# Patient Record
Sex: Male | Born: 1955 | Race: White | Hispanic: No | Marital: Married | Smoking: Never smoker
Health system: Southern US, Community
[De-identification: ages and names within clinical notes are randomized; demographics above are authoritative.]

## PROBLEM LIST (undated history)

## (undated) DIAGNOSIS — I10 Essential (primary) hypertension: Secondary | ICD-10-CM

## (undated) DIAGNOSIS — R251 Tremor, unspecified: Secondary | ICD-10-CM

## (undated) DIAGNOSIS — E785 Hyperlipidemia, unspecified: Secondary | ICD-10-CM

## (undated) DIAGNOSIS — F419 Anxiety disorder, unspecified: Secondary | ICD-10-CM

## (undated) DIAGNOSIS — K649 Unspecified hemorrhoids: Secondary | ICD-10-CM

## (undated) HISTORY — PX: COLONOSCOPY: SHX174

## (undated) HISTORY — DX: Tremor, unspecified: R25.1

## (undated) HISTORY — DX: Anxiety disorder, unspecified: F41.9

## (undated) HISTORY — DX: Unspecified hemorrhoids: K64.9

## (undated) HISTORY — DX: Essential (primary) hypertension: I10

## (undated) HISTORY — DX: Hyperlipidemia, unspecified: E78.5

---
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
## 2005-07-05 ENCOUNTER — Ambulatory Visit: Payer: Self-pay | Admitting: Family Medicine

## 2006-12-30 ENCOUNTER — Emergency Department: Payer: Self-pay | Admitting: Emergency Medicine

## 2006-12-31 ENCOUNTER — Other Ambulatory Visit: Payer: Self-pay

## 2007-03-19 ENCOUNTER — Ambulatory Visit: Payer: Self-pay | Admitting: Family Medicine

## 2009-08-30 ENCOUNTER — Ambulatory Visit: Payer: Self-pay | Admitting: Gastroenterology

## 2010-10-11 ENCOUNTER — Observation Stay: Payer: Self-pay | Admitting: Internal Medicine

## 2012-07-15 ENCOUNTER — Emergency Department: Payer: Self-pay | Admitting: Unknown Physician Specialty

## 2012-07-15 LAB — COMPREHENSIVE METABOLIC PANEL
Albumin: 4 g/dL (ref 3.4–5.0)
Alkaline Phosphatase: 78 U/L (ref 50–136)
Anion Gap: 6 — ABNORMAL LOW (ref 7–16)
BUN: 11 mg/dL (ref 7–18)
Bilirubin,Total: 0.3 mg/dL (ref 0.2–1.0)
Calcium, Total: 8.5 mg/dL (ref 8.5–10.1)
Chloride: 107 mmol/L (ref 98–107)
Co2: 29 mmol/L (ref 21–32)
Creatinine: 1.08 mg/dL (ref 0.60–1.30)
EGFR (African American): >60
EGFR (Non-African Amer.): >60
Glucose: 88 mg/dL (ref 65–99)
Osmolality: 282 (ref 275–301)
Potassium: 4.5 mmol/L (ref 3.5–5.1)
SGOT(AST): 26 U/L (ref 15–37)
SGPT (ALT): 28 U/L (ref 12–78)
Sodium: 142 mmol/L (ref 136–145)
Total Protein: 7.7 g/dL (ref 6.4–8.2)

## 2012-07-15 LAB — CBC
HCT: 44.4 % (ref 40.0–52.0)
HGB: 15.2 g/dL (ref 13.0–18.0)
MCH: 31.9 pg (ref 26.0–34.0)
MCHC: 34.2 g/dL (ref 32.0–36.0)
MCV: 93 fL (ref 80–100)
Platelet: 275 10*3/uL (ref 150–440)
RBC: 4.76 10*6/uL (ref 4.40–5.90)
RDW: 13.1 % (ref 11.5–14.5)
WBC: 7.7 10*3/uL (ref 3.8–10.6)

## 2012-07-15 LAB — CK TOTAL AND CKMB (NOT AT ARMC)
CK, Total: 156 U/L (ref 35–232)
CK-MB: 1 ng/mL (ref 0.5–3.6)

## 2012-07-15 LAB — TROPONIN I: Troponin-I: <0.02 ng/mL

## 2013-10-22 ENCOUNTER — Ambulatory Visit: Payer: Self-pay | Admitting: Family Medicine

## 2015-03-08 ENCOUNTER — Encounter: Payer: Self-pay | Admitting: Psychiatry

## 2015-03-08 ENCOUNTER — Ambulatory Visit (INDEPENDENT_AMBULATORY_CARE_PROVIDER_SITE_OTHER): Payer: 59 | Admitting: Psychiatry

## 2015-03-08 VITALS — BP 138/88 | HR 70 | Temp 98.0°F | Ht 68.5 in

## 2015-03-08 DIAGNOSIS — F331 Major depressive disorder, recurrent, moderate: Secondary | ICD-10-CM

## 2015-03-08 DIAGNOSIS — F411 Generalized anxiety disorder: Secondary | ICD-10-CM | POA: Diagnosis not present

## 2015-03-08 NOTE — Progress Notes (Signed)
Psychiatric Initial Adult Assessment   Patient Identification: Sean Bailey MRN:  [id number] Date of Evaluation:  03/08/2015 Referral Source: Dr Sherryll BurgerShah, PCP  Chief Complaint:  "I quit my last 2 jobs".  Visit Diagnosis: No diagnosis found. Diagnosis:  There are no active problems to display for this patient.  History of Present Illness:    Pt is a 59 yo married male, presented for initial assessment. He stated that he was referred by his PCP, Dr Sherryll BurgerShah.  He stated that  he does not feel depressed but " I am loosing passion, sense of value, worth, and no excitement in any thing. He stated that he is still interested in different activities but does not know what is his "passion ". 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 Patient reported that he is concerned as his 57 year old daughter lives with him along with her children. 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 He reported that he has received some financial stability in his life after the death of his mother due to her property and they were able to pay some of their bills and he has also invested.  He reported that he does not have any relationship problems with his wife. He reported that he will wake up early in the morning and go to the gym to exercise with his friends. Patient reported that he does not think that he needs medication at this time as he has already stopped taking the Lexapro as it was not helping him. He takes Xanax on a when necessary basis. He currently denied having any suicidal homicidal ideations or plans. He denied having any perceptual disturbances.  He feels that not having a stable job is the main stressor in his life which is  making him depressed and he wants to talk to her therapist about achieving balance.  Elements:  Location:  as noted above. . Duration:  for the past year. Associated Signs/Symptoms: Depression Symptoms:  depressed mood, anhedonia, hypersomnia, fatigue, hopelessness, disturbed sleep, (Hypo) Manic Symptoms:  Elevated Mood, Irritable Mood, Anxiety Symptoms:  Excessive Worry, Psychotic Symptoms:  none PTSD Symptoms: Negative  Past Medical History:  Past Medical History  Diagnosis Date  . Hyperlipidemia   . Hypertension   . Anxiety     Past Surgical History  Procedure Laterality Date  . Appendectomy     Family History:  Family History  Problem Relation Age of Onset  . Depression Brother    Social History:   History   Social History  . Marital Status: Married    Spouse Name: N/A  . Number of Children: N/A  . Years of Education: N/A   Social History Main Topics  . Smoking status: Never Smoker   . Smokeless tobacco: Former NeurosurgeonUser    Quit date: 03/07/2014  . Alcohol Use: 2.4 oz/week    0 Standard drinks or equivalent, 3 Glasses of wine, 0 Shots of liquor, 1 Cans of beer per week  . Drug Use: No  . Sexual Activity: Yes    Birth Control/ Protection: None   Other Topics Concern  . None   Social History Narrative  . 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 Married since 1988. Lives with wife and a 34 yo daughter. He stated that he is not taking Lexapro as it was not helpful. Taking xanax only prn at night.   Musculoskeletal: Strength & Muscle Tone: within normal limits Gait & Station: normal Patient leans: Right  Psychiatric Specialty Exam: HPI  Review of Systems  Constitutional: Positive for malaise/fatigue. Negative for chills.  HENT: Negative for ear discharge and hearing loss.   Eyes: Negative for photophobia.  Respiratory: Negative for  hemoptysis.   Cardiovascular: Negative for palpitations and leg swelling.  Gastrointestinal: Positive for abdominal pain.  Genitourinary: Negative for frequency.  Musculoskeletal: Positive for back pain and neck pain.  Skin: Negative for itching.  Neurological: Negative for sensory change and seizures.  Endo/Heme/Allergies: Negative for environmental allergies.  Psychiatric/Behavioral: Negative for depression and substance abuse. The patient is nervous/anxious.     Blood pressure 138/88, pulse 70, temperature 98 F (36.7 C), temperature source Tympanic, height 5' 8.5" (1.74 m), SpO2 95 %.There is no weight on file to calculate BMI.  General Appearance: Casual  Eye Contact:  Fair  Speech:  Normal Rate  Volume:  Normal  Mood:  Anxious and Depressed  Affect:  Appropriate  Thought Process:  Logical  Orientation:  Full (Time, Place, and Person)  Thought Content:  WDL  Suicidal Thoughts:  No  Homicidal Thoughts:  No  Memory:  Immediate;   Fair  Judgement:  Fair  Insight:  Fair  Psychomotor Activity:  Normal  Concentration:  Fair  Recall:  [employer] of Knowledge:Fair  Language: Fair  Akathisia:  No  Handed:  Right  AIMS (if indicated):  none  Assets:  Communication Skills Desire for Improvement Physical Health  ADL's:  Intact  Cognition: WNL  Sleep:  6   Is the patient at risk to self?  No. Has the patient been a risk to self in the past 6 months?  No. Has the patient been a risk to self within the distant past?  No. Is the patient a risk to others?  No. Has the patient been a risk to others in the past 6 months?  No. Has the patient been a risk to others within the distant past?  No.  Allergies:  No Known Allergies Current Medications: No current outpatient prescriptions on file.   No current facility-administered medications for this visit.    Previous Psychotropic Medications: Dr Eliott Nine[facility] [place] - last year  Dr Sherryll Burger- PCP started on  medications but Pt does not rememeber.    Substance Abuse History in the last 12 months:  No.  Consequences of Substance Abuse: Negative  Medical Decision Making:  Established Problem, Stable/Improving (1)  Treatment Plan Summary: Medication management and Plan Discussed with pt and he agreed.   Patient currently denied having any suicidal ideations or plans and does not want to take any psychotherapy medication He reported that he has also called career. Counseling is [facility] and they have suggested that she should seek therapy and medication management However patient is not interested in medications at this time He will start therapy and will come back for a follow-up appointment in 2 months No medications dispensed at this time    Sean Bailey 6/7/20161:16 PM

## 2015-03-16 ENCOUNTER — Telehealth: Payer: Self-pay | Admitting: Psychiatry

## 2015-03-23 ENCOUNTER — Ambulatory Visit (INDEPENDENT_AMBULATORY_CARE_PROVIDER_SITE_OTHER): Payer: 59 | Admitting: Licensed Clinical Social Worker

## 2015-03-23 ENCOUNTER — Other Ambulatory Visit: Payer: Self-pay

## 2015-03-23 ENCOUNTER — Encounter: Payer: Self-pay | Admitting: Licensed Clinical Social Worker

## 2015-03-23 DIAGNOSIS — F331 Major depressive disorder, recurrent, moderate: Secondary | ICD-10-CM

## 2015-03-23 DIAGNOSIS — F411 Generalized anxiety disorder: Secondary | ICD-10-CM | POA: Diagnosis not present
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
## 2015-03-23 NOTE — Telephone Encounter (Signed)
this is 2nd time patient called about rx.  according to your office visit note no medication was order but pt states that you was suppose to given him a medication for his tremors.

## 2015-03-24 NOTE — Telephone Encounter (Signed)
Pt declined medications and was referred for therapy.

## 2015-04-06 ENCOUNTER — Ambulatory Visit (INDEPENDENT_AMBULATORY_CARE_PROVIDER_SITE_OTHER): Payer: 59 | Admitting: Licensed Clinical Social Worker

## 2015-04-06 DIAGNOSIS — F411 Generalized anxiety disorder: Secondary | ICD-10-CM | POA: Diagnosis not present

## 2015-04-06 DIAGNOSIS — F331 Major depressive disorder, recurrent, moderate: Secondary | ICD-10-CM | POA: Diagnosis not present

## 2015-04-06 NOTE — Progress Notes (Signed)
THERAPIST PROGRESS NOTE  Patient:  Sean Bailey   DOB: [birth year]  MR Number: [id number]  Location: [facility], [address], [place], [place] [address]  Start: 9:05 a.m. End: 10:15 a.m.  Provider/Observer:     Felecia Jan, MSW, LCSW   Reason For Service:     Anxiety, Phase of Life Problem, Depression  Content of Session:   Sean Bailey, Sean Bailey, as client prefers to be called, is a friendly, anxious, casually groomed 59 year old MWM who returns to OPT to address his anxiety primarily. "The concern I have is if I follow my gut is it leading me down the wrong road?" Continues to    question self "What should I be doing and is my approach the best.?" He is questioning himself RE: "Am I doing enough?  Should I have stayed with insurance? "No one would accuse me of being successful."  In response to LCSW's question about    his beliefs about his success, client stated "I didn't say that I thought I was unsuccessful."     "I'm trying to focus on things so that I don't have the anxiety.  I feel unsettled about the path that I am on." He feels somewhat optimistic that there are positive things that he is doing at present.  "My current retirement age is 15 because I don't see    have the resources to retire before then."  His evidence for being on the right path would be working somewhere with benefits and insurance."  Primary struggle that increases his daily anxiety is around being able to provide financially for his family.     Family dynamics were discussed with one adult daughter and her children living in the home and wife not working, further reinforcing client's belief that he is responsible for the financial security of his family.     Ongoing struggle to decide what he wants to do with his life and he talked more about a business/character education program that he would like to pursue yet is realistic that he needs a full time job.  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 I don't have a crystal ball."     Rated his anxiety as a 6 out of 10.  "I need to see that my ability to pull in income is getting better."  He has not started the Lexapro that is in client's medication order section of the chart.  He is agreeable to taking this as prescribed by Dr. Garnetta Buddy.     Reported taking the Xanax and that he has about two pills left.  Was uncertain if he had a refill.  Informed LCSW that he sees Dr. Garnetta Buddy again in August 2016.  Voiced some anxiety around upcoming beach trip citing "This will be our first family    vacation in at least 15 years."   Participation Level:   Active  Participation Quality:  Attentive      Behavioral Observation:  Casual, Alert, and Blunt.   Current Psychosocial Factors: Family dynamics with adult daughter not working and living in the home; insufficient employment/income; wife's health  Current Symptoms:  Anxiety, shaky self-esteem, racing thoughts, indecisiveness  Patient Progress:   Exceptional X  Steady Slow Regressing   Stable   Maintaining    Suicidal Ideation/Intention/Plan:     Not present  Target Goals:   "To focus on the thinking errors."  Decrease anxiety responses  Goals Addressed Today:    Use of cognitive re-framing to decrease anxious and depressed thoughts.  Barriers:   Indecisiveness; family dynamics; financial strain  Therapist Response/Interventions:  Assisted client to begin to increase insight and identification into patterns of certain thoughts and behaviors and the resulting consequences.  Supportive therapy with insight to assist client to begin identifying thinking errors that he may be operating        with and assisted to re-frame rigid view of himself and negative impact of certain words such as "should, should not, ought to, always, never, must  ..."  LCSW explored client's automatic thoughts with assertiveness and not meeting others' needs       and offered much education around boundary setting, detachment with love and unhealthy enabling behaviors.  Encouraged client to consider what role early childhood criticism has played in creating an internal critic that challenges and questions       his ability to make sound decisions.  Offered ongoing support and empowerment RE: client's protective factors and validated feelings as also a part of a phase of life dilemma.  Provided him handout that discusses Marilynne Halstedrik Erickson's Generativity vs       Stagnation psychosocial developmental stage of life.  Encouraged client to read this hand-out and to highlight those thinking errors that apply to him on a daily basis with goal of next session to begin using CBT models to identify and re-frame these       irrational thoughts.  Diagnosis:   Generalized Anxiety Disorder                       Major Depressive Disorder, Recurrent, Moderate   Plan:  LCSW will collaborate with Dr. Garnetta BuddyFaheem regarding order for Lexapro 10mg .  Client will complete all readings assigned, keep all scheduled appointments and communicate any side effects of medication or worsening symptoms to Dr. Garnetta BuddyFaheem or LCSW.  Follow up for OPT in two             weeks.      Felecia Janina Benjimin Hadden, LCSW 04/06/2015

## 2015-04-11 ENCOUNTER — Telehealth: Payer: Self-pay | Admitting: Licensed Clinical Social Worker

## 2015-04-14 NOTE — Telephone Encounter (Signed)
Discussed case with Dr. Garnetta BuddyFaheem. LCSW called client and confirmed that PCP ordered the Lexapro 10 mg & Alprazolam, both of which he is out of. He only took 30 days of Lexapro. Recommended call to PCP for refills both meds until appointment with Dr. Garnetta BuddyFaheem   By Felecia Janina Darreld Hoffer, LCSW

## 2015-04-18 ENCOUNTER — Ambulatory Visit (INDEPENDENT_AMBULATORY_CARE_PROVIDER_SITE_OTHER): Payer: 59 | Admitting: Licensed Clinical Social Worker

## 2015-04-18 DIAGNOSIS — F331 Major depressive disorder, recurrent, moderate: Secondary | ICD-10-CM | POA: Diagnosis not present

## 2015-04-18 DIAGNOSIS — F411 Generalized anxiety disorder: Secondary | ICD-10-CM | POA: Diagnosis not present

## 2015-04-18 NOTE — Progress Notes (Signed)
THERAPIST PROGRESS NOTE  Session Time:  9:07 a.m. - 10:15 a.m.  Participation Level: Active  Behavioral Response: Well GroomedAlertAnxious and Depressed  Type of Therapy: Individual Therapy  Treatment Goals addressed: Anxiety and Coping  Interventions: Strength-based, Supportive, Family Systems and Reframing  Summary: Sean Bailey is a 59 y.o. male who presents with depression and anxiety along with phase of life issues regarding being unemployed in his field of study. Regrets voiced about leaving last job and client spends a great deal of time re-playing this decision yet is able to recognize that he chose to leave for appropriate reasons such as to care for his aging mother.  Present struggle is anxiety about stability and security for his family.   He is awaiting call back from his pharmacy who had to contact client's PCP to receive a refill on the Lexapro.  "I don't know what to do."  He has been without the medication for one month. "There's more irritability at times."  Nothing solidified per client and he is not satisfied with his efforts stating "I think I need to push it up a level."  He discussed things to do like redoing his resume and ongoing effort to apply to jobs and meet prospective employers.  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  Overall, client reported that his "Energy is better, sleep seems sufficient, appetite okay, I don't think I've been moody but I have been more irritable but that's a daughter thing."  He discussed ongoing stressors with daughter living in the home with her son. "I was happy with her esponse last night."  He admits to often ". . . Not saying things in the best way."  Goals: "I need to review my resumes and see if I'm presenting myself the way I need to be presenting myself."  "I need to feel comfortable  that what I'm putting in front of potential employers is a good product."   [facility]   Follow up on stress management info and cognitive behavioral strategies  Mr. Sean BonitoMilling denied additional concerns or questions for LCSW. He did not indicate additional goals or focus for future therapy sessions.  He is relatively stable and will see Dr. Garnetta BuddyFaheem in about one month.  He stated that he will make an appointment to see LCSW after that appointment.  Suicidal/Homicidal: Negativewithout intent/plan  Therapist Response:    Active and reflective listening to assist client with identification of his strengths and accomplishments.  Normalized his feelings and dilemmas as part of the human condition. Suggested that he read hand-outs provided on stress management and CBT to gain more understanding into the role of negative thinking patterns on mood and outlook.  Supportive therapy with insight to reinforce interventions to re-frame rigid view of situations and himself especially with use of words such as: "should, should not, ought to, always, never, must  ..."   Encouraged client to consider what role early childhood criticism plays in any struggles with self-confidence and self-worth.  Explored various suggestions in terms of "fleshing out" his idea for discussed program such as research online and meetings/interviews with people at local school board/superindendent's office and school program called STEM.  Encouraged calls to LCSW PRN.   Diagnosis: Generalized Anxiety Disorder  Major Depressive Disorder, Recurrent, Moderate  Plan: Return again PRN.   Felecia Jan, LCSW 04/18/2015

## 2015-04-25 ENCOUNTER — Other Ambulatory Visit: Payer: Self-pay | Admitting: Family Medicine

## 2015-04-25 NOTE — Telephone Encounter (Signed)
Called patient and he is scheduling appointment for medication refill in August

## 2015-04-25 NOTE — Telephone Encounter (Signed)
Medication refill sent to [facility] [address] per pharmacy request. Patient needs to schedule appointment for medication refill.

## 2015-05-02 ENCOUNTER — Ambulatory Visit (INDEPENDENT_AMBULATORY_CARE_PROVIDER_SITE_OTHER): Payer: 59 | Admitting: Licensed Clinical Social Worker

## 2015-05-02 DIAGNOSIS — F331 Major depressive disorder, recurrent, moderate: Secondary | ICD-10-CM | POA: Diagnosis not present

## 2015-05-02 DIAGNOSIS — F411 Generalized anxiety disorder: Secondary | ICD-10-CM

## 2015-05-02 NOTE — Progress Notes (Signed)
THERAPIST PROGRESS NOTE  Session Time:  9:05 a.m. -    Participation Level: Active  Behavioral Response: CasualAlertAnxious and Depressed  Type of Therapy: Individual Therapy  Treatment Goals addressed: Coping  Interventions: CBT, Solution Focused, Strength-based, Supportive, Family Systems and Reframing  Summary: Sean Bailey is a 59 y.o. male who presents for OPT.  He either did not follow through with his primary care provider or they did not return his call as he remains out of both his anxiolytic and anti-depressant.  He will see Dr. Gretel Bailey in this office within next several weeks.  "I don't think I've gotten as much done as I could."  He and family will be taking a vacation in the next week and client talked about his disappointment in that his family do not follow his recommendations for preparing for the trip.  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 "I just haven't done much with it."  He is hoping to "flesh out" when he meets with the program coordinator at [employer].  He talked about family reunion with his brother and client's own family and this is the first family vacation in 10 years.  He discussed differences in he and wife's approaches and client shared how he feels his opinion doesn't match yet denies that he does not suppress feelings and admits "There are other ways to look at it. I just let it go."  He discussed immediate anxieties that are future oriented and related to financial stability. He operates with the cognitive distortion associated with fear of failure that includes loss of home, security, food and not having basic needs met.   Additional stressor is the dependency of their daughter who lives in the home with a 21 year old grand-daughter and 17 year old grand-son.  Discussion of family dynamics especially around enabling and further described  self as a "social oaf" not feeling as refined. He shared various social situations that supports this belief about himself.  Overall identified himself as stuck in a role given to him at age 5 by his mother when parents divorced.   Sean Bailey admits that he delegates tasks to other family members yet they are often not followed through with.  "They don't do things the way I do or would"  "It's like putting in a lot of energy and not getting anything back."  This was how he described current family dynamics.  Another area of concern is how he tends to be more behind the scenes in social events at church and expressed some interest in talking more about this in future sessions.  Suicidal/Homicidal: Negativewithout intent/plan  Therapist Response:    Discussed negative role of enabling, co-dependency features and lack of healthy boundaries on a person's emotional health and quality of life.  Discussed various strategies for deciding whether or not the choices he is making is helping his family or reinforcing a sense of entitlement and lack of self responsibility.  Active and reflective listening to assist client with identification of his strengths and accomplishments.  Normalized his feelings and dilemmas as part of the human condition while empowering him that through healthy and firm boundary setting that he can begin to feel more empowered. Offered hand out today on understanding where self-criticism comes from and how to decrease or rid this type of thinking error from his life.  Supportive therapy with insight was provided.  Diagnosis: Generalized Anxiety Disorder                      Major Depressive Disorder, Recurrent, Moderate  Plan: Return again PRN.    Sean Dibble, LCSW 05/02/2015   THERAPIST PROGRESS NOTE  Session Time:  9:07 a.m. - 10:15 a.m.  Participation Level: Active  Behavioral Response: Well GroomedAlertAnxious and Depressed  Type of Therapy: Individual Therapy  Treatment  Goals addressed: Anxiety and Coping  Interventions: Strength-based, Supportive, Family Systems and Reframing  Summary: Sean Bailey is a 59 y.o. male who presents with depression and anxiety along with phase of life issues regarding being unemployed in his field of study. Regrets voiced about leaving last job and client spends a great deal of time re-playing this decision yet is able to recognize that he chose to leave for appropriate reasons such as to care for his aging mother.  Present struggle is anxiety about stability and security for his family.   He is awaiting call back from his pharmacy who had to contact client's PCP to receive a refill on the Lexapro.  "I don't know what to do."  He has been without the medication for one month. "There's more irritability at times."  Nothing solidified per client and he is not satisfied with his efforts stating "I think I need to push it up a level."  He discussed things to do like redoing his resume and ongoing effort to apply to jobs and meet prospective employers.  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  Overall, client reported that his "Energy is better, sleep seems sufficient, appetite okay, I don't think I've been moody but I have been more irritable but that's a daughter thing."  He discussed ongoing stressors with daughter living in the home with her son. "I was happy with her esponse last night."  He admits to often ". . . Not saying things in the best way."  Goals: "I need to review my resumes and see if I'm presenting myself the way I need to be presenting myself."  "I need to feel comfortable that what I'm putting in front of potential employers is a good product."   Follow up with contact at [facility]   Follow up on stress management info and cognitive behavioral strategies  Mr. Sean Bailey denied additional  concerns or questions for LCSW. He did not indicate additional goals or focus for future therapy sessions.  He is relatively stable and will see Dr. Gretel Bailey in about one month.  He stated that he will make an appointment to see LCSW after that appointment.  Suicidal/Homicidal: Negativewithout intent/plan  Therapist Response:    Active and reflective listening to assist client with identification of his strengths and accomplishments.  Normalized his feelings and dilemmas as part of the human condition. Suggested that he read hand-outs provided on stress management and CBT to gain more understanding into the role of negative thinking patterns on mood and outlook.  Supportive therapy with insight to reinforce interventions to re-frame rigid view of situations and himself especially with use of words such as: "should, should not, ought to, always, never, must  ..."   Encouraged client to consider what role early childhood criticism plays in any struggles with self-confidence and self-worth.  Explored various suggestions in terms of "fleshing out" his idea for discussed program such  as [employer] and meetings/interviews with people at local school board/superindendent's office and school program called STEM.  Encouraged calls to LCSW PRN.   Diagnosis: Generalized Anxiety Disorder                      Major Depressive Disorder, Recurrent, Moderate  Plan: Return again PRN.   Sean Dibble, LCSW 05/02/2015

## 2015-05-16 ENCOUNTER — Ambulatory Visit (INDEPENDENT_AMBULATORY_CARE_PROVIDER_SITE_OTHER): Payer: 59 | Admitting: Family Medicine

## 2015-05-16 ENCOUNTER — Encounter: Payer: Self-pay | Admitting: Family Medicine

## 2015-05-16 VITALS — BP 140/75 | HR 85 | Temp 98.2°F | Resp 19 | Ht 69.0 in | Wt 194.0 lb

## 2015-05-16 DIAGNOSIS — G8929 Other chronic pain: Secondary | ICD-10-CM

## 2015-05-16 DIAGNOSIS — F411 Generalized anxiety disorder: Secondary | ICD-10-CM

## 2015-05-16 DIAGNOSIS — M542 Cervicalgia: Secondary | ICD-10-CM | POA: Diagnosis not present

## 2015-05-16 MED ORDER — ALPRAZOLAM 0.25 MG PO TABS: 0.2500 mg | ORAL_TABLET | Freq: Three times a day (TID) | ORAL | Status: DC | PRN

## 2015-05-16 NOTE — Progress Notes (Signed)
Name: Sean Bailey   MRN: [id number]    DOB: 10/18/1955   Date:05/16/2015       Progress Note  Subjective  Chief Complaint  Chief Complaint  Patient presents with  . Medication Refill  . Anxiety  . Back Pain    Anxiety Presents for follow-up visit. Symptoms include excessive worry, insomnia, muscle tension and nervous/anxious behavior. Patient reports no depressed mood, palpitations or shortness of breath.   His past medical history is significant for anxiety/panic attacks and depression. Past treatments include benzodiazephines and SSRIs. The treatment provided significant relief. Compliance with prior treatments has been good (Stopped taking Lexapro after 1 month.).  Neck Pain  This is a chronic problem. The problem occurs intermittently. The problem has been unchanged. The pain is associated with a sleep position. The pain is present in the midline. The quality of the pain is described as aching. The pain is at a severity of 4/10. The pain is mild. He has tried neck support and home exercises for the symptoms.  no radicular symptoms, no numbness/weakness/paresthesias in either extremity.    Past Medical History  Diagnosis Date  . Hyperlipidemia   . Hypertension   . Anxiety   . Tremors of nervous system     Past Surgical History  Procedure Laterality Date  . Appendectomy      Family History  Problem Relation Age of Onset  . Depression Brother   . Depression Mother   . Anxiety disorder Mother   . Alcohol abuse Father     Social History   Social History  . Marital Status: Married    Spouse Name: N/A  . Number of Children: N/A  . Years of Education: N/A   Occupational History  . Not on file.   Social History Main Topics  . Smoking status: Never Smoker   . Smokeless tobacco: Former Neurosurgeon    Quit date: 03/07/2014  . Alcohol Use: 2.4 oz/week    3 Glasses of wine, 1 Cans of beer, 0 Shots of liquor, 0 Standard drinks or equivalent per week  . Drug Use: No  .  Sexual Activity: Yes    Birth Control/ Protection: None   Other Topics Concern  . Not on file   Social History Narrative     Current outpatient prescriptions:  .  ALPRAZolam (XANAX) 0.25 MG tablet, Take 0.25 mg by mouth 3 (three) times daily as needed for anxiety., Disp: , Rfl:  .  aspirin 81 MG tablet, Take by mouth., Disp: , Rfl:  .  Cholecalciferol (VITAMIN D) 2000 UNITS tablet, Take by mouth., Disp: , Rfl:  .  diphenhydrAMINE (ALLERGY MEDICATION) 25 MG tablet, Take by mouth., Disp: , Rfl:  .  MULTIPLE VITAMIN PO, Take by mouth., Disp: , Rfl:  .  escitalopram (LEXAPRO) 10 MG tablet, Take 10 mg by mouth daily., Disp: , Rfl:   Allergies  Allergen Reactions  . Latex      Review of Systems  Respiratory: Negative for shortness of breath.   Cardiovascular: Negative for palpitations.  Musculoskeletal: Positive for back pain and neck pain.  Psychiatric/Behavioral: The patient is nervous/anxious and has insomnia.       Objective  Filed Vitals:   05/16/15 0922  BP: 140/75  Pulse: 85  Temp: 98.2 F (36.8 C)  TempSrc: Oral  Resp: 19  Height: 5\' 9"  (1.753 m)  Weight: 194 lb (87.998 kg)  SpO2: 97%    Physical Exam  Constitutional: He is oriented to person, place,  and time and well-developed, well-nourished, and in no distress.  Cardiovascular: Normal rate and regular rhythm.   Pulmonary/Chest: Effort normal and breath sounds normal.  Musculoskeletal:       Cervical back: He exhibits normal range of motion, no tenderness, no bony tenderness and no pain.       Back:  Neurological: He is alert and oriented to person, place, and time.  Psychiatric: Affect and judgment normal.  Nursing note and vitals reviewed.    Assessment & Plan  1. Generalized anxiety disorder Symptoms responsive to alprazolam as needed. Refills provided. Patient is aware of the dependence potential of benzodiazepines. Follow-up in 3 months. - ALPRAZolam (XANAX) 0.25 MG tablet; Take 1 tablet  (0.25 mg total) by mouth 3 (three) times daily as needed for anxiety.  Dispense: 90 tablet; Refill: 0  2. Chronic neck pain Commended conservative treatment at this time. Neck pain is most likely from muscle spasm and recommended a heating pad and NSAID as needed. RTC if no clinical improvement for radiographs of the cervical spine.   Sims Laday Asad A[facility] 05/16/2015 9:29 AM

## 2015-05-17 ENCOUNTER — Ambulatory Visit (INDEPENDENT_AMBULATORY_CARE_PROVIDER_SITE_OTHER): Payer: 59 | Admitting: Psychiatry

## 2015-05-17 ENCOUNTER — Encounter: Payer: Self-pay | Admitting: Psychiatry

## 2015-05-17 VITALS — BP 124/82 | HR 74 | Temp 97.1°F | Ht 68.0 in | Wt 194.2 lb

## 2015-05-17 DIAGNOSIS — F419 Anxiety disorder, unspecified: Secondary | ICD-10-CM | POA: Insufficient documentation

## 2015-05-17 DIAGNOSIS — F331 Major depressive disorder, recurrent, moderate: Secondary | ICD-10-CM | POA: Diagnosis not present

## 2015-05-17 DIAGNOSIS — Z0000 Encounter for general adult medical examination without abnormal findings: Secondary | ICD-10-CM | POA: Insufficient documentation

## 2015-05-17 DIAGNOSIS — L409 Psoriasis, unspecified: Secondary | ICD-10-CM | POA: Insufficient documentation

## 2015-05-17 DIAGNOSIS — E559 Vitamin D deficiency, unspecified: Secondary | ICD-10-CM | POA: Insufficient documentation

## 2015-05-17 DIAGNOSIS — M542 Cervicalgia: Secondary | ICD-10-CM

## 2015-05-17 DIAGNOSIS — M549 Dorsalgia, unspecified: Secondary | ICD-10-CM | POA: Insufficient documentation

## 2015-05-17 DIAGNOSIS — E7800 Pure hypercholesterolemia, unspecified: Secondary | ICD-10-CM | POA: Insufficient documentation

## 2015-05-17 DIAGNOSIS — G8929 Other chronic pain: Secondary | ICD-10-CM | POA: Insufficient documentation

## 2015-05-17 DIAGNOSIS — F321 Major depressive disorder, single episode, moderate: Secondary | ICD-10-CM | POA: Insufficient documentation

## 2015-05-17 NOTE — Progress Notes (Signed)
Behavioral Health MD Follow Up Note  Patient Identification: Sean Bailey MRN:  [id number] Date of Evaluation:  05/17/2015 Referral Source: Dr Sherryll Burger, PCP  Chief Complaint:   Chief Complaint    Follow-up    "I quit my last 2 jobs".  Visit Diagnosis:    ICD-9-CM ICD-10-CM   1. MDD (major depressive disorder), recurrent episode, moderate 296.32 F33.1    Diagnosis:   Patient Active Problem List   Diagnosis Date Noted  . Anxiety [F41.9] 05/17/2015  . Back ache [M54.9] 05/17/2015  . Encounter for general adult medical examination without abnormal findings [Z00.00] 05/17/2015  . Hypercholesteremia [E78.0] 05/17/2015  . Depression, major, single episode, moderate [F32.1] 05/17/2015  . Chronic cervical pain [M54.2, G89.29] 05/17/2015  . Psoriasis [L40.9] 05/17/2015  . Avitaminosis D [E55.9] 05/17/2015  . Chronic neck pain [M54.2, G89.29] 05/16/2015  . MDD (major depressive disorder), recurrent episode, moderate [F33.1] 04/18/2015  . Generalized anxiety disorder [F41.1] 04/18/2015   History of Present Illness:    Pt is a 59 yo married male, presented for follow-up. He reported that he stopped taking the Lexapro after a month as he did not notice any significant improvement of the medication. He reported that he was seen by Dr. Sherryll Burger yesterday. He has refilled his alprazolam and he has been taking it on a regular basis. He reported that he feels better on the Xanax. He takes it at least 2-3 times per day. Patient reported that he is looking for a job although he is currently working on a part-time basis as a Biomedical scientist as well as at [employer]. He has a job interview today at 1 PM. He appeared calm during the interview. He currently denied having any suicidal ideations and plans.      Past Medical History:  Past Medical History  Diagnosis Date  . Hyperlipidemia   . Hypertension   . Anxiety   . Tremors of nervous system     Past Surgical History  Procedure Laterality Date  .  Appendectomy     Family History:  Family History  Problem Relation Age of Onset  . Depression Brother   . Anxiety disorder Brother   . Depression Mother   . Anxiety disorder Mother   . Lung disease Mother   . Stroke Mother   . Alcohol abuse Father   . Brain cancer Father   . Prostate cancer Father    Social History:   Social History   Social History  . Marital Status: Married    Spouse Name: N/A  . Number of Children: N/A  . Years of Education: N/A   Social History Main Topics  . Smoking status: Never Smoker   . Smokeless tobacco: Former Neurosurgeon    Types: Snuff, Chew    Quit date: 03/07/2014  . Alcohol Use: 2.4 oz/week    3 Glasses of wine, 1 Cans of beer, 0 Shots of liquor, 0 Standard drinks or equivalent per week  . Drug Use: No  . Sexual Activity: Yes    Birth Control/ Protection: None   Other Topics Concern  . None   Social History Narrative   Additional Social History: Working at [employer] and also as Biomedical scientist.   Musculoskeletal: Strength & Muscle Tone: within normal limits Gait & Station: normal Patient leans: Right  Psychiatric Specialty Exam: HPI   Review of Systems  Constitutional: Positive for malaise/fatigue. Negative for chills.  HENT: Negative for ear discharge and hearing loss.   Eyes: Negative for photophobia.  Respiratory: Negative for hemoptysis.   Cardiovascular: Negative for palpitations and leg swelling.  Gastrointestinal: Positive for abdominal pain.  Genitourinary: Negative for frequency.  Musculoskeletal: Positive for back pain and neck pain.  Skin: Negative for itching.  Neurological: Negative for sensory change and seizures.  Endo/Heme/Allergies: Negative for environmental allergies.  Psychiatric/Behavioral: Negative for depression and substance abuse. The patient is nervous/anxious.     Blood pressure 124/82, pulse 74, temperature 97.1 F (36.2 C), temperature source Tympanic, height  (1.727 m), weight 194 lb 3.2 oz  (88.089 kg), SpO2 97 %.Body mass index is 29.54 kg/(m^2).  General Appearance: Casual  Eye Contact:  Fair  Speech:  Normal Rate  Volume:  Normal  Mood:  Anxious and Depressed  Affect:  Appropriate  Thought Process:  Logical  Orientation:  Full (Time, Place, and Person)  Thought Content:  WDL  Suicidal Thoughts:  No  Homicidal Thoughts:  No  Memory:  Immediate;   Fair  Judgement:  Fair  Insight:  Fair  Psychomotor Activity:  Normal  Concentration:  Fair  Recall:  [employer] of Knowledge:Fair  Language: Fair  Akathisia:  No  Handed:  Right  AIMS (if indicated):  none  Assets:  Communication Skills Desire for Improvement Physical Health  ADL's:  Intact  Cognition: WNL  Sleep:  6   Is the patient at risk to self?  No. Has the patient been a risk to self in the past 6 months?  No. Has the patient been a risk to self within the distant past?  No. Is the patient a risk to others?  No. Has the patient been a risk to others in the past 6 months?  No. Has the patient been a risk to others within the distant past?  No.  Allergies:   Allergies  Allergen Reactions  . Latex    Current Medications: Current Outpatient Prescriptions  Medication Sig Dispense Refill  . ALPRAZolam (XANAX) 0.25 MG tablet Take 1 tablet (0.25 mg total) by mouth 3 (three) times daily as needed for anxiety. 90 tablet 0  . aspirin 81 MG tablet Take by mouth.    . Cholecalciferol (VITAMIN D) 2000 UNITS tablet Take by mouth.    . diphenhydrAMINE (ALLERGY MEDICATION) 25 MG tablet Take by mouth.    . MULTIPLE VITAMIN PO Take by mouth.     No current facility-administered medications for this visit.    Previous Psychotropic Medications: Dr Eliott Nine[facility] [place] - last year  Dr Sherryll Burger- PCP started on medications but Pt does not rememeber.    Substance Abuse History in the last 12 months:  No.  Consequences of Substance Abuse: Negative  Medical Decision Making:  Established Problem,  Stable/Improving (1)  Treatment Plan Summary: Medication management and Plan Discussed with pt and he agreed.   Patient currently denied having any suicidal ideations or plans  He is currently on alprazolam which is prescribed by Dr. Sherryll Burger. He will follow up with Felecia Jan on a regular basis No follow-up appointments made at this time as he is following up with his primary care physician and getting medications from him.   More than 50% of the time spent in psychoeducation, counseling and coordination of care.    This note was generated in part or whole with voice recognition software. Voice regonition is usually quite accurate but there are transcription errors that can and very often do occur. I apologize for any typographical errors that were not detected and corrected.  Brandy Hale 8/16/20169:12 AM

## 2015-05-23 ENCOUNTER — Ambulatory Visit: Payer: Self-pay | Admitting: Licensed Clinical Social Worker

## 2015-07-11 ENCOUNTER — Telehealth: Payer: Self-pay | Admitting: Family Medicine

## 2015-07-11 NOTE — Telephone Encounter (Signed)
PT SAID THAT HE IS GETTING A DENIAL SAYING A REFERRAL WAS NOT RECEIVED ON DOS 03-08-15 OR 03-23-15. PLEASE ADVISE. REF WAS TO [facility].

## 2015-07-18 NOTE — Telephone Encounter (Signed)
Have called [facility] and multiple messages to have someone call me back. I have tried to the billing dept but, they don't open until 10:00am. I'm not sure why a referral wasn't done. I will try to get them to resend his claim. Tried to call the pt but, no answer and vm is full.

## 2015-07-26 ENCOUNTER — Ambulatory Visit (INDEPENDENT_AMBULATORY_CARE_PROVIDER_SITE_OTHER): Payer: 59 | Admitting: Family Medicine

## 2015-07-26 ENCOUNTER — Other Ambulatory Visit: Payer: Self-pay | Admitting: Family Medicine

## 2015-07-26 ENCOUNTER — Encounter: Payer: Self-pay | Admitting: Family Medicine

## 2015-07-26 VITALS — BP 142/86 | HR 92 | Temp 98.8°F | Resp 16 | Ht 68.5 in | Wt 194.5 lb

## 2015-07-26 DIAGNOSIS — K648 Other hemorrhoids: Secondary | ICD-10-CM | POA: Diagnosis not present

## 2015-07-26 DIAGNOSIS — Z23 Encounter for immunization: Secondary | ICD-10-CM | POA: Diagnosis not present

## 2015-07-26 DIAGNOSIS — K625 Hemorrhage of anus and rectum: Secondary | ICD-10-CM | POA: Diagnosis not present

## 2015-07-26 DIAGNOSIS — K644 Residual hemorrhoidal skin tags: Secondary | ICD-10-CM

## 2015-07-26 DIAGNOSIS — K6289 Other specified diseases of anus and rectum: Secondary | ICD-10-CM | POA: Insufficient documentation

## 2015-07-26 LAB — POC HEMOCCULT BLD/STL (OFFICE/1-CARD/DIAGNOSTIC): Fecal Occult Blood, POC: POSITIVE — AB

## 2015-07-26 NOTE — Progress Notes (Signed)
Name: Sean Bailey   MRN: [id number]    DOB: 02/18/1956   Date:07/26/2015       Progress Note  Subjective  Chief Complaint  Chief Complaint  Patient presents with  . Urinary Frequency    Patient states he is going to bathroom frequently due to drinking alot of coffee but feels like he is having a hardly time controlling his urinary flow.  . Hemorrhoids    Onset-6 years but has grown, Dry blood and when wiping has noticed the blood.     HPI  Rectal Lump Noticed a solid rectal lump 2 weeks ago, along with bright red blood when wiping with a toilet paper but no blood in the stool or in the toilet. No pain when having a BM. Stools are soft in consistency.  Past Medical History  Diagnosis Date  . Hyperlipidemia   . Hypertension   . Anxiety   . Tremors of nervous system     Past Surgical History  Procedure Laterality Date  . Appendectomy      Family History  Problem Relation Age of Onset  . Depression Brother   . Anxiety disorder Brother   . Depression Mother   . Anxiety disorder Mother   . Lung disease Mother   . Stroke Mother   . Alcohol abuse Father   . Brain cancer Father   . Prostate cancer Father     Social History   Social History  . Marital Status: Married    Spouse Name: N/A  . Number of Children: N/A  . Years of Education: N/A   Occupational History  . Not on file.   Social History Main Topics  . Smoking status: Never Smoker   . Smokeless tobacco: Former NeurosurgeonUser    Types: Snuff, Chew    Quit date: 03/07/2014  . Alcohol Use: 2.4 oz/week    3 Glasses of wine, 1 Cans of beer, 0 Shots of liquor, 0 Standard drinks or equivalent per week  . Drug Use: No  . Sexual Activity: Yes    Birth Control/ Protection: None   Other Topics Concern  . Not on file   Social History Narrative    Current outpatient prescriptions:  .  ALPRAZolam (XANAX) 0.25 MG tablet, Take 1 tablet (0.25 mg total) by mouth 3 (three) times daily as needed for anxiety., Disp: 90  tablet, Rfl: 0 .  aspirin 81 MG tablet, Take by mouth., Disp: , Rfl:  .  Cholecalciferol (VITAMIN D) 2000 UNITS tablet, Take by mouth., Disp: , Rfl:  .  diphenhydrAMINE (ALLERGY MEDICATION) 25 MG tablet, Take by mouth., Disp: , Rfl:  .  ibuprofen (ADVIL,MOTRIN) 200 MG tablet, Take 600 mg by mouth every 6 (six) hours as needed for mild pain., Disp: , Rfl:  .  MULTIPLE VITAMIN PO, Take by mouth., Disp: , Rfl:   Allergies  Allergen Reactions  . Latex    Review of Systems  Gastrointestinal: Positive for blood in stool.    Objective  Filed Vitals:   07/26/15 1430  BP: 142/86  Pulse: 92  Temp: 98.8 F (37.1 C)  TempSrc: Oral  Resp: 16  Height: 5' 8.5" (1.74 m)  Weight: 194 lb 8 oz (88.225 kg)  SpO2: 96%    Physical Exam  Constitutional: He is well-developed, well-nourished, and in no distress.  Genitourinary: Rectal exam shows mass. Rectal exam shows no internal hemorrhoid, no fissure and no laceration. Guaiac positive stool.  Solid, non-tender, mobile mass at the peri-rectal area.  Internal exam revealed no palpable mass, but pt. Experienced some tenderness.  Nursing note and vitals reviewed.  Assessment & Plan  1. Needs flu shot  - Flu Vaccine QUAD 36+ mos PF IM (Fluarix & Fluzone Quad PF)  2. External hemorrhoid, bleeding Hemoccult in office is positive. Patient will be referred to general surgery for evaluation of the external hemorrhoid - POC Hemoccult Bld/Stl (1-Cd Office Dx) - CBC with Differential  3. Bleeding per rectum Referral to gastroenterology for colonoscopy to determine the source of lower GI bleeding. Obtain CBC to rule out anemia. - Ambulatory referral to Gastroenterology      Kathi Ludwig Asad A. [facility] 07/26/2015 2:45 PM

## 2015-07-27 ENCOUNTER — Ambulatory Visit (INDEPENDENT_AMBULATORY_CARE_PROVIDER_SITE_OTHER): Payer: 59 | Admitting: General Surgery

## 2015-07-27 ENCOUNTER — Encounter: Payer: Self-pay | Admitting: General Surgery

## 2015-07-27 VITALS — BP 138/84 | HR 78 | Resp 14 | Ht 68.5 in | Wt 195.0 lb

## 2015-07-27 DIAGNOSIS — K645 Perianal venous thrombosis: Secondary | ICD-10-CM

## 2015-07-27 LAB — CBC WITH DIFFERENTIAL/PLATELET
Basophils Absolute: 0 10*3/uL (ref 0.0–0.2)
Basos: 0 %
EOS (ABSOLUTE): 0.1 10*3/uL (ref 0.0–0.4)
Eos: 1 %
Hematocrit: 43.8 % (ref 37.5–51.0)
Hemoglobin: 14.7 g/dL (ref 12.6–17.7)
Immature Grans (Abs): 0 10*3/uL (ref 0.0–0.1)
Immature Granulocytes: 0 %
Lymphocytes Absolute: 2.5 10*3/uL (ref 0.7–3.1)
Lymphs: 25 %
MCH: 31.5 pg (ref 26.6–33.0)
MCHC: 33.6 g/dL (ref 31.5–35.7)
MCV: 94 fL (ref 79–97)
Monocytes Absolute: 1.1 10*3/uL — ABNORMAL HIGH (ref 0.1–0.9)
Monocytes: 11 %
Neutrophils Absolute: 6.2 10*3/uL (ref 1.4–7.0)
Neutrophils: 63 %
Platelets: 279 10*3/uL (ref 150–379)
RBC: 4.66 x10E6/uL (ref 4.14–5.80)
RDW: 13.6 % (ref 12.3–15.4)
WBC: 9.9 10*3/uL (ref 3.4–10.8)

## 2015-07-27 NOTE — Patient Instructions (Addendum)
The patient is aware to call back for any questions or concerns. Use warm water to clean area do not use soap.  Hemorrhoids Hemorrhoids are swollen veins around the rectum or anus. There are two types of hemorrhoids:   Internal hemorrhoids. These occur in the veins just inside the rectum. They may poke through to the outside and become irritated and painful.  External hemorrhoids. These occur in the veins outside the anus and can be felt as a painful swelling or hard lump near the anus. CAUSES  Pregnancy.   Obesity.   Constipation or diarrhea.   Straining to have a bowel movement.   Sitting for long periods on the toilet.  Heavy lifting or other activity that caused you to strain.  Anal intercourse. SYMPTOMS   Pain.   Anal itching or irritation.   Rectal bleeding.   Fecal leakage.   Anal swelling.   One or more lumps around the anus.  DIAGNOSIS  Your caregiver may be able to diagnose hemorrhoids by visual examination. Other examinations or tests that may be performed include:   Examination of the rectal area with a gloved hand (digital rectal exam).   Examination of anal canal using a small tube (scope).   A blood test if you have lost a significant amount of blood.  A test to look inside the colon (sigmoidoscopy or colonoscopy). TREATMENT Most hemorrhoids can be treated at home. However, if symptoms do not seem to be getting better or if you have a lot of rectal bleeding, your caregiver may perform a procedure to help make the hemorrhoids get smaller or remove them completely. Possible treatments include:   Placing a rubber band at the base of the hemorrhoid to cut off the circulation (rubber band ligation).   Injecting a chemical to shrink the hemorrhoid (sclerotherapy).   Using a tool to burn the hemorrhoid (infrared light therapy).   Surgically removing the hemorrhoid (hemorrhoidectomy).   Stapling the hemorrhoid to block blood flow to the  tissue (hemorrhoid stapling).  HOME CARE INSTRUCTIONS   Eat foods with fiber, such as whole grains, beans, nuts, fruits, and vegetables. Ask your doctor about taking products with added fiber in them (fibersupplements).  Increase fluid intake. Drink enough water and fluids to keep your urine clear or pale yellow.   Exercise regularly.   Go to the bathroom when you have the urge to have a bowel movement. Do not wait.   Avoid straining to have bowel movements.   Keep the anal area dry and clean. Use wet toilet paper or moist towelettes after a bowel movement.   Medicated creams and suppositories may be used or applied as directed.   Only take over-the-counter or prescription medicines as directed by your caregiver.   Take warm sitz baths for 15-20 minutes, 3-4 times a day to ease pain and discomfort.   Place ice packs on the hemorrhoids if they are tender and swollen. Using ice packs between sitz baths may be helpful.   Put ice in a plastic bag.   Place a towel between your skin and the bag.   Leave the ice on for 15-20 minutes, 3-4 times a day.   Do not use a donut-shaped pillow or sit on the toilet for long periods. This increases blood pooling and pain.  SEEK MEDICAL CARE IF:  You have increasing pain and swelling that is not controlled by treatment or medicine.  You have uncontrolled bleeding.  You have difficulty or you are unable to  have a bowel movement.  You have pain or inflammation outside the area of the hemorrhoids. MAKE SURE YOU:  Understand these instructions.  Will watch your condition.  Will get help right away if you are not doing well or get worse.   This information is not intended to replace advice given to you by your health care provider. Make sure you discuss any questions you have with your health care provider.   Document Released: 09/14/2000 Document Revised: 09/03/2012 Document Reviewed: 07/22/2012 Elsevier Interactive Patient  Education [employer]

## 2015-07-27 NOTE — Progress Notes (Signed)
Patient ID: Sean Bailey, male   DOB: 12/02/1955, 59 y.o.   MRN: [id number]  Chief Complaint  Patient presents with  . Rectal Bleeding    HPI Sean Bailey is a 59 y.o. male.  Here today for evaluation of rectal bleeding with possible rectal mass. He has had bleeding occasionally in the past. He does feel a possible lump at the rectum for 2 weeks, but with the recent bleeding it seemed smaller. He does have hemorrhoids and was guaiac positive at Dr. Sherryll BurgerShah office yesterday. He has seen blood on the stool and on thew tissues. His bowels move regular(loose) and daily but he has lactose intolerance which he thinks affects his bowels. Bleeding on and off  for a couple of years. He does not have firm stools.   HPI  Past Medical History  Diagnosis Date  . Hyperlipidemia   . Hypertension   . Anxiety   . Tremors of nervous system   . Hemorrhoid     Past Surgical History  Procedure Laterality Date  . Appendectomy  1966  . Colonoscopy  2005, 2010    Dr Ricki RodriguezSkulski    Family History  Problem Relation Age of Onset  . Depression Brother   . Anxiety disorder Brother   . Depression Mother   . Anxiety disorder Mother   . Lung disease Mother   . Stroke Mother   . Alcohol abuse Father   . Brain cancer Father   . Prostate cancer Father     Social History Social History  Substance Use Topics  . Smoking status: Never Smoker   . Smokeless tobacco: Former NeurosurgeonUser    Types: Snuff, Chew    Quit date: 03/07/2014  . Alcohol Use: 2.4 oz/week    3 Glasses of wine, 1 Cans of beer, 0 Shots of liquor, 0 Standard drinks or equivalent per week     Comment: 1/week    Allergies  Allergen Reactions  . Latex     Current Outpatient Prescriptions  Medication Sig Dispense Refill  . ALPRAZolam (XANAX) 0.25 MG tablet Take 1 tablet (0.25 mg total) by mouth 3 (three) times daily as needed for anxiety. 90 tablet 0  . aspirin 81 MG tablet Take by mouth.    . calcipotriene (DOVONOX) 0.005 % cream Apply  topically.    . Cetirizine HCl 10 MG CAPS Take by mouth.    . Cholecalciferol (VITAMIN D) 2000 UNITS tablet Take by mouth.    Marland Kitchen. ibuprofen (ADVIL,MOTRIN) 200 MG tablet Take 600 mg by mouth every 6 (six) hours as needed for mild pain.    Marland Kitchen. LACTASE PO Take by mouth.    . MULTIPLE VITAMIN PO Take by mouth.     No current facility-administered medications for this visit.    Review of Systems Review of Systems  Constitutional: Negative.   Respiratory: Negative.   Cardiovascular: Negative.   Gastrointestinal: Positive for anal bleeding. Negative for abdominal pain.    Blood pressure 138/84, pulse 78, resp. rate 14, height 5' 8.5" (1.74 m), weight 195 lb (88.451 kg).  Physical Exam Physical Exam  Constitutional: He is oriented to person, place, and time. He appears well-developed and well-nourished.  Eyes: No scleral icterus.  Cardiovascular: Normal rate, regular rhythm and normal heart sounds.   Pulmonary/Chest: Effort normal and breath sounds normal.  Abdominal: Soft. Bowel sounds are normal. A hernia is present.  5mm umbilical defect   Genitourinary: Prostate normal. Rectal exam shows external hemorrhoid. Rectal exam shows no fissure  and no tenderness. Guaiac negative stool.     1 x 2 cm hemorrhoid left posterior side   Neurological: He is alert and oriented to person, place, and time.  Skin: Skin is warm and dry.    Data Reviewed PCP note of 07/26/2015.  Colonoscopy report of 08/30/2009 completed by Barnetta Chapel, M.D. Denied lymphoid aggregates in both the right and left colon. Nonbleeding internal hemorrhoids.  Assessment    Perianal changes secondary to chronic exposure to soap.  Thrombosed external hemorrhoid, resolving.    Plan    No indication for surgical intervention unless the area becomes symptomatic or further bleeding is noted. The patient would be due for a screening colonoscopy in 2020.     Use warm water to clean do not use soap.   PCP:  Deborra Medina, Merrily Pew 07/27/2015, 8:20 PM

## 2015-08-05 ENCOUNTER — Telehealth: Payer: Self-pay | Admitting: Licensed Clinical Social Worker

## 2015-08-29 ENCOUNTER — Ambulatory Visit (INDEPENDENT_AMBULATORY_CARE_PROVIDER_SITE_OTHER): Payer: 59 | Admitting: Family Medicine

## 2015-08-29 ENCOUNTER — Encounter: Payer: Self-pay | Admitting: Family Medicine

## 2015-08-29 DIAGNOSIS — J111 Influenza due to unidentified influenza virus with other respiratory manifestations: Secondary | ICD-10-CM | POA: Insufficient documentation

## 2015-08-29 DIAGNOSIS — J069 Acute upper respiratory infection, unspecified: Secondary | ICD-10-CM | POA: Insufficient documentation

## 2015-08-29 LAB — POCT INFLUENZA A/B
Influenza A, POC: POSITIVE — AB
Influenza B, POC: POSITIVE — AB

## 2015-08-29 MED ORDER — OSELTAMIVIR PHOSPHATE 75 MG PO CAPS: 75.0000 mg | ORAL_CAPSULE | Freq: Two times a day (BID) | ORAL | Status: DC

## 2015-08-29 NOTE — Progress Notes (Signed)
Name: Sean Bailey   MRN: [id number]    DOB: Feb 07, 1956   Date:08/29/2015       Progress Note  Subjective  Chief Complaint  Chief Complaint  Patient presents with  . Acute Visit    Possi Flu    Influenza This is a new problem. The current episode started yesterday. Associated symptoms include arthralgias, chills, coughing, fatigue, a fever and a sore throat. Associated symptoms comments: Thinks he may have had the flu, symptoms started yesterday. Wife was seen today for symptoms of bronchitis.Marland Kitchen He has tried acetaminophen for the symptoms.    Past Medical History  Diagnosis Date  . Hyperlipidemia   . Hypertension   . Anxiety   . Tremors of nervous system   . Hemorrhoid     Past Surgical History  Procedure Laterality Date  . Appendectomy  1966  . Colonoscopy  2005, 2010    Dr Ricki Rodriguez    Family History  Problem Relation Age of Onset  . Depression Brother   . Anxiety disorder Brother   . Depression Mother   . Anxiety disorder Mother   . Lung disease Mother   . Stroke Mother   . Alcohol abuse Father   . Brain cancer Father   . Prostate cancer Father     Social History   Social History  . Marital Status: Married    Spouse Name: N/A  . Number of Children: N/A  . Years of Education: N/A   Occupational History  . Not on file.   Social History Main Topics  . Smoking status: Never Smoker   . Smokeless tobacco: Former Neurosurgeon    Types: Snuff, Chew    Quit date: 03/07/2014  . Alcohol Use: 2.4 oz/week    3 Glasses of wine, 1 Cans of beer, 0 Shots of liquor, 0 Standard drinks or equivalent per week     Comment: 1/week  . Drug Use: No  . Sexual Activity: Yes    Birth Control/ Protection: None   Other Topics Concern  . Not on file   Social History Narrative     Current outpatient prescriptions:  .  aspirin 81 MG tablet, Take by mouth., Disp: , Rfl:  .  calcipotriene (DOVONOX) 0.005 % cream, Apply topically., Disp: , Rfl:  .  Cholecalciferol (VITAMIN D)  2000 UNITS tablet, Take by mouth., Disp: , Rfl:  .  ibuprofen (ADVIL,MOTRIN) 200 MG tablet, Take 600 mg by mouth every 6 (six) hours as needed for mild pain., Disp: , Rfl:  .  LACTASE PO, Take by mouth., Disp: , Rfl:  .  MULTIPLE VITAMIN PO, Take by mouth., Disp: , Rfl:  .  ALPRAZolam (XANAX) 0.25 MG tablet, Take 1 tablet (0.25 mg total) by mouth 3 (three) times daily as needed for anxiety. (Patient not taking: Reported on 08/29/2015), Disp: 90 tablet, Rfl: 0 .  Cetirizine HCl 10 MG CAPS, Take by mouth., Disp: , Rfl:   Allergies  Allergen Reactions  . Latex      Review of Systems  Constitutional: Positive for fever, chills and fatigue.  HENT: Positive for sore throat.   Respiratory: Positive for cough.   Musculoskeletal: Positive for arthralgias.     Objective  Filed Vitals:   08/29/15 1435  BP: 139/80  Pulse: 85  Temp: 98.7 F (37.1 C)  TempSrc: Oral  Resp: 16  Height:  (1.753 m)  Weight: 193 lb 9.6 oz (87.816 kg)  SpO2: 98%    Physical Exam  Constitutional:  He is oriented to person, place, and time and well-developed, well-nourished, and in no distress.  HENT:  Head: Normocephalic and atraumatic.  Nose: Right sinus exhibits no maxillary sinus tenderness and no frontal sinus tenderness. Left sinus exhibits no maxillary sinus tenderness and no frontal sinus tenderness.  Mouth/Throat: Mucous membranes are normal. Posterior oropharyngeal edema and posterior oropharyngeal erythema present. No oropharyngeal exudate.  Neck: Neck supple.  Cardiovascular: Normal rate, regular rhythm and normal heart sounds.   No murmur heard. Pulmonary/Chest: Effort normal and breath sounds normal.  Neurological: He is alert and oriented to person, place, and time.  Nursing note and vitals reviewed.  Assessment & Plan  1. Influenza Positive flu A/B. we'll start on Tamiflu 75 mg twice a day. Advised to take Robitussin for cough and increased fluid intake. - POCT Influenza A/B -  oseltamivir (TAMIFLU) 75 MG capsule; Take 1 capsule (75 mg total) by mouth 2 (two) times daily.  Dispense: 10 capsule; Refill: 0   Ginnie Marich Asad A. [facility] 08/29/2015 3:15 PM

## 2015-11-10 ENCOUNTER — Encounter: Payer: Self-pay | Admitting: Family Medicine

## 2015-11-23 ENCOUNTER — Encounter: Payer: Self-pay | Admitting: Family Medicine

## 2015-11-23 ENCOUNTER — Ambulatory Visit (INDEPENDENT_AMBULATORY_CARE_PROVIDER_SITE_OTHER): Payer: BLUE CROSS/BLUE SHIELD | Admitting: Family Medicine

## 2015-11-23 VITALS — BP 136/74 | HR 78 | Temp 97.9°F | Resp 16 | Ht 69.0 in | Wt 180.2 lb

## 2015-11-23 DIAGNOSIS — Z021 Encounter for pre-employment examination: Secondary | ICD-10-CM

## 2015-11-23 DIAGNOSIS — Z111 Encounter for screening for respiratory tuberculosis: Secondary | ICD-10-CM

## 2015-11-23 NOTE — Progress Notes (Signed)
Name: Sean Bailey   MRN: [id number]    DOB: 1956-03-14   Date:11/23/2015       Progress Note  Subjective  Chief Complaint  Chief Complaint  Patient presents with  . Annual Exam    CPE    HPI  Pt. 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 He has the form that needs to be filled out.    Past Medical History  Diagnosis Date  . Hyperlipidemia   . Hypertension   . Anxiety   . Tremors of nervous system   . Hemorrhoid     Past Surgical History  Procedure Laterality Date  . Appendectomy  1966  . Colonoscopy  2005, 2010    Dr Ricki Rodriguez    Family History  Problem Relation Age of Onset  . Depression Brother   . Anxiety disorder Brother   . Depression Mother   . Anxiety disorder Mother   . Lung disease Mother   . Stroke Mother   . Alcohol abuse Father   . Brain cancer Father   . Prostate cancer Father     Social History   Social History  . Marital Status: Married    Spouse Name: N/A  . Number of Children: N/A  . Years of Education: N/A   Occupational History  . Not on file.   Social History Main Topics  . Smoking status: Never Smoker   . Smokeless tobacco: Former Neurosurgeon    Types: Snuff, Chew    Quit date: 03/07/2014  . Alcohol Use: 2.4 oz/week    3 Glasses of wine, 1 Cans of beer, 0 Shots of liquor, 0 Standard drinks or equivalent per week     Comment: 1/week  . Drug Use: No  . Sexual Activity: Yes    Birth Control/ Protection: None   Other Topics Concern  . Not on file   Social History Narrative     Current outpatient prescriptions:  .  aspirin 81 MG tablet, Take by mouth., Disp: , Rfl:  .  calcipotriene (DOVONOX) 0.005 % cream, Apply topically., Disp: , Rfl:  .  Cetirizine HCl 10 MG CAPS, Take by mouth., Disp: , Rfl:  .  Cholecalciferol (VITAMIN D) 2000 UNITS tablet, Take by mouth., Disp: , Rfl:  .  ibuprofen (ADVIL,MOTRIN) 200 MG tablet, Take 600 mg by mouth every 6 (six) hours as  needed for mild pain., Disp: , Rfl:  .  LACTASE PO, Take by mouth., Disp: , Rfl:  .  MULTIPLE VITAMIN PO, Take by mouth., Disp: , Rfl:  .  ALPRAZolam (XANAX) 0.25 MG tablet, Take 1 tablet (0.25 mg total) by mouth 3 (three) times daily as needed for anxiety. (Patient not taking: Reported on 08/29/2015), Disp: 90 tablet, Rfl: 0 .  oseltamivir (TAMIFLU) 75 MG capsule, Take 1 capsule (75 mg total) by mouth 2 (two) times daily. (Patient not taking: Reported on 11/23/2015), Disp: 10 capsule, Rfl: 0  Allergies  Allergen Reactions  . Latex      Review of Systems  Constitutional: Negative for fever and chills.  HENT: Negative for hearing loss.   Eyes: Negative for blurred vision and double vision.  Respiratory: Negative for cough (occasional coughing from mucus drainage) and shortness of breath.   Cardiovascular: Negative for chest pain and palpitations.    Objective  Filed Vitals:   11/23/15 0859  BP: 136/74  Pulse: 78  Temp: 97.9 F (36.6 C)  TempSrc: Oral  Resp: 16  Height:  (1.753 m)  Weight: 180 lb 3.2 oz (81.738 kg)  SpO2: 98%    Physical Exam  Constitutional: He is oriented to person, place, and time and well-developed, well-nourished, and in no distress.  Cardiovascular: Normal rate and regular rhythm.   Pulmonary/Chest: Effort normal and breath sounds normal.  Musculoskeletal:       Lumbar back: He exhibits no tenderness and no pain.       Right hand: Normal.       Left hand: Normal.  Neurological: He is alert and oriented to person, place, and time. He has normal strength.  Psychiatric: Memory, affect and judgment normal.  Nursing note and vitals reviewed.   Assessment & Plan  1. Encounter for pre-employment examination Completed vision and hearing screening, examined organ systems listed on the certificate. Pt. asked to bring records of immunizations for review and to certify vaccination status.  2. PPD screening test  - PPD   [name] Asad A.  [facility] 11/23/2015 9:19 AM

## 2016-01-23 ENCOUNTER — Ambulatory Visit (INDEPENDENT_AMBULATORY_CARE_PROVIDER_SITE_OTHER): Payer: BLUE CROSS/BLUE SHIELD

## 2016-01-23 DIAGNOSIS — A184 Tuberculosis of skin and subcutaneous tissue: Secondary | ICD-10-CM | POA: Diagnosis not present

## 2016-01-25 LAB — TB SKIN TEST
Induration: 0 mm
TB Skin Test: NEGATIVE

## 2016-07-18 ENCOUNTER — Encounter: Payer: Self-pay | Admitting: Family Medicine

## 2016-07-18 ENCOUNTER — Ambulatory Visit (INDEPENDENT_AMBULATORY_CARE_PROVIDER_SITE_OTHER): Payer: BLUE CROSS/BLUE SHIELD | Admitting: Family Medicine

## 2016-07-18 VITALS — BP 118/72 | HR 81 | Temp 99.8°F | Resp 18 | Ht 69.0 in | Wt 191.9 lb

## 2016-07-18 DIAGNOSIS — J029 Acute pharyngitis, unspecified: Secondary | ICD-10-CM

## 2016-07-18 DIAGNOSIS — R059 Cough, unspecified: Secondary | ICD-10-CM

## 2016-07-18 DIAGNOSIS — R05 Cough: Secondary | ICD-10-CM | POA: Diagnosis not present

## 2016-07-18 LAB — POCT RAPID STREP A (OFFICE): Rapid Strep A Screen: NEGATIVE

## 2016-07-18 MED ORDER — AMOXICILLIN-POT CLAVULANATE 875-125 MG PO TABS: 1.0000 | ORAL_TABLET | Freq: Two times a day (BID) | ORAL | 0 refills | Status: DC

## 2016-07-18 MED ORDER — BENZONATATE 200 MG PO CAPS: 200.0000 mg | ORAL_CAPSULE | Freq: Three times a day (TID) | ORAL | 0 refills | Status: DC | PRN

## 2016-07-18 NOTE — Progress Notes (Signed)
Name: Sean Bailey   MRN: [id number]    DOB: 12/18/1955   Date:07/18/2016       Progress Note  Subjective  Chief Complaint  Chief Complaint  Patient presents with  . URI    cough, nasal congestion, scratchy throat for 2 days    Sore Throat   This is a new problem. The current episode started in the past 7 days (2 days ago). The problem has been unchanged. There has been no fever. Associated symptoms include congestion (started 3 days ago), coughing and headaches. Pertinent negatives include no abdominal pain. 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 He has tried nothing for the symptoms.     Past Medical History:  Diagnosis Date  . Anxiety   . Hemorrhoid   . Hyperlipidemia   . Hypertension   . Tremors of nervous system     Past Surgical History:  Procedure Laterality Date  . APPENDECTOMY  1966  . COLONOSCOPY  2005, 2010   Dr Ricki RodriguezSkulski    Family History  Problem Relation Age of Onset  . Depression Brother   . Anxiety disorder Brother   . Depression Mother   . Anxiety disorder Mother   . Lung disease Mother   . Stroke Mother   . Alcohol abuse Father   . Brain cancer Father   . Prostate cancer Father     Social History   Social History  . Marital status: Married    Spouse name: N/A  . Number of children: N/A  . Years of education: N/A   Occupational History  . Not on file.   Social History Main Topics  . Smoking status: Never Smoker  . Smokeless tobacco: Former NeurosurgeonUser    Types: Snuff, Chew    Quit date: 03/07/2014  . Alcohol use 2.4 oz/week    3 Glasses of wine, 1 Cans of beer per week     Comment: 1/week  . Drug use: No  . Sexual activity: Yes    Birth control/ protection: None   Other Topics Concern  . Not on file   Social History Narrative  . No narrative on file     Current Outpatient Prescriptions:  .  aspirin 81 MG tablet, Take by mouth., Disp: , Rfl:  .  calcipotriene  (DOVONOX) 0.005 % cream, Apply topically., Disp: , Rfl:  .  Cetirizine HCl 10 MG CAPS, Take by mouth., Disp: , Rfl:  .  Cholecalciferol (VITAMIN D) 2000 UNITS tablet, Take by mouth., Disp: , Rfl:  .  ibuprofen (ADVIL,MOTRIN) 200 MG tablet, Take 600 mg by mouth every 6 (six) hours as needed for mild pain., Disp: , Rfl:  .  LACTASE PO, Take by mouth., Disp: , Rfl:  .  MULTIPLE VITAMIN PO, Take by mouth., Disp: , Rfl:   Allergies  Allergen Reactions  . Latex      Review of Systems  Constitutional: Negative for chills, fever and malaise/fatigue.  HENT: Positive for congestion (started 3 days ago) and sore throat.   Respiratory: Positive for cough.   Gastrointestinal: Negative for abdominal pain.  Neurological: Positive for headaches.     Objective  Vitals:   07/18/16 0919  BP: 118/72  Pulse: 81  Resp: 18  Temp: 99.8 F (37.7 C)  TempSrc: Oral  SpO2: 97%  Weight: 191 lb 14.4 oz (87 kg)  Height: 5\' 9"  (1.753 m)    Physical Exam  Constitutional: He is oriented to person, place, and time and well-developed, well-nourished, and in no distress.  HENT:  Head: Normocephalic and atraumatic.  Right Ear: Tympanic membrane and ear canal normal.  Left Ear: Tympanic membrane and ear canal normal.  Nose: Right sinus exhibits no maxillary sinus tenderness and no frontal sinus tenderness. Left sinus exhibits no maxillary sinus tenderness and no frontal sinus tenderness.  Mouth/Throat: Posterior oropharyngeal erythema present. No oropharyngeal exudate or posterior oropharyngeal edema.  Cardiovascular: Normal rate, regular rhythm, S1 normal, S2 normal and normal heart sounds.   Pulmonary/Chest: Effort normal and breath sounds normal. He has no wheezes.  Neurological: He is alert and oriented to person, place, and time.  Nursing note and vitals reviewed.    Assessment & Plan  1. Sore throat Point-of-care strep testing is negative - POCT rapid strep A  2. Pharyngitis, unspecified  etiology Likely viral, however given the risk of exposure to strep we will start on antimicrobial therapy to be taken if his symptoms do not improve in the next 48 hours. - amoxicillin-clavulanate (AUGMENTIN) 875-125 MG tablet; Take 1 tablet by mouth 2 (two) times daily.  Dispense: 14 tablet; Refill: 0  3. Cough Likely from sinus drainage. - benzonatate (TESSALON) 200 MG capsule; Take 1 capsule (200 mg total) by mouth 3 (three) times daily as needed for cough.  Dispense: 21 capsule; Refill: 0   Shaarav Ripple Asad A. [facility] 07/18/2016 9:40 AM

## 2016-08-29 ENCOUNTER — Encounter: Payer: Self-pay | Admitting: Family Medicine

## 2016-08-29 ENCOUNTER — Ambulatory Visit (INDEPENDENT_AMBULATORY_CARE_PROVIDER_SITE_OTHER): Payer: BLUE CROSS/BLUE SHIELD | Admitting: Family Medicine

## 2016-08-29 VITALS — BP 122/67 | HR 83 | Temp 99.5°F | Resp 17 | Ht 69.0 in | Wt 194.9 lb

## 2016-08-29 DIAGNOSIS — M5442 Lumbago with sciatica, left side: Secondary | ICD-10-CM | POA: Diagnosis not present

## 2016-08-29 DIAGNOSIS — G8929 Other chronic pain: Secondary | ICD-10-CM | POA: Diagnosis not present

## 2016-08-29 DIAGNOSIS — E7800 Pure hypercholesterolemia, unspecified: Secondary | ICD-10-CM

## 2016-08-29 DIAGNOSIS — M5412 Radiculopathy, cervical region: Secondary | ICD-10-CM | POA: Diagnosis not present

## 2016-08-29 DIAGNOSIS — M5441 Lumbago with sciatica, right side: Secondary | ICD-10-CM | POA: Diagnosis not present

## 2016-08-29 DIAGNOSIS — Z23 Encounter for immunization: Secondary | ICD-10-CM | POA: Diagnosis not present

## 2016-08-29 MED ORDER — TIZANIDINE HCL 4 MG PO CAPS: 4.0000 mg | ORAL_CAPSULE | Freq: Three times a day (TID) | ORAL | 0 refills | Status: DC | PRN

## 2016-08-29 MED ORDER — MELOXICAM 15 MG PO TABS: 15.0000 mg | ORAL_TABLET | Freq: Every day | ORAL | 0 refills | Status: DC

## 2016-08-29 NOTE — Progress Notes (Signed)
Name: Sean Bailey   MRN: [id number]    DOB: 11/18/1955   Date:08/29/2016       Progress Note  Subjective  Chief Complaint  Chief Complaint  Patient presents with  . Follow-up    3 mo  . Back Pain    x2 months  . Leg Pain    x2 months    Back Pain  This is a chronic problem. The pain is present in the lumbar spine and thoracic spine. The pain radiates to the left thigh and right thigh. The pain is at a severity of 6/10. The pain is moderate. Associated symptoms include paresthesias (in feet mainly after standing.). Pertinent negatives include no numbness, paresis or tingling. He has tried analgesics and NSAIDs for the symptoms.  Neck Pain   This is a chronic problem. The current episode started more than 1 year ago (3-4 years ago). The problem has been unchanged. The pain is present in the midline. The quality of the pain is described as shooting and cramping. The pain is at a severity of 3/10. The symptoms are aggravated by position. Pertinent negatives include no numbness, paresis or tingling. He has tried acetaminophen and NSAIDs for the symptoms. The treatment provided moderate relief.     Past Medical History:  Diagnosis Date  . Anxiety   . Hemorrhoid   . Hyperlipidemia   . Hypertension   . Tremors of nervous system     Past Surgical History:  Procedure Laterality Date  . APPENDECTOMY  1966  . COLONOSCOPY  2005, 2010   Dr Ricki RodriguezSkulski    Family History  Problem Relation Age of Onset  . Depression Brother   . Anxiety disorder Brother   . Depression Mother   . Anxiety disorder Mother   . Lung disease Mother   . Stroke Mother   . Alcohol abuse Father   . Brain cancer Father   . Prostate cancer Father     Social History   Social History  . Marital status: Married    Spouse name: N/A  . Number of children: N/A  . Years of education: N/A   Occupational History  . Not on file.   Social History Main Topics  . Smoking status: Never Smoker  . Smokeless  tobacco: Former NeurosurgeonUser    Types: Snuff, Chew    Quit date: 03/07/2014  . Alcohol use 2.4 oz/week    3 Glasses of wine, 1 Cans of beer per week     Comment: 1/week  . Drug use: No  . Sexual activity: Yes    Birth control/ protection: None   Other Topics Concern  . Not on file   Social History Narrative  . No narrative on file     Current Outpatient Prescriptions:  .  aspirin 81 MG tablet, Take by mouth., Disp: , Rfl:  .  calcipotriene (DOVONOX) 0.005 % cream, Apply topically., Disp: , Rfl:  .  Cetirizine HCl 10 MG CAPS, Take by mouth., Disp: , Rfl:  .  Cholecalciferol (VITAMIN D) 2000 UNITS tablet, Take by mouth., Disp: , Rfl:  .  ibuprofen (ADVIL,MOTRIN) 200 MG tablet, Take 600 mg by mouth every 6 (six) hours as needed for mild pain., Disp: , Rfl:  .  LACTASE PO, Take by mouth., Disp: , Rfl:  .  MULTIPLE VITAMIN PO, Take by mouth., Disp: , Rfl:  .  amoxicillin-clavulanate (AUGMENTIN) 875-125 MG tablet, Take 1 tablet by mouth 2 (two) times daily. (Patient not taking: Reported on  08/29/2016), Disp: 14 tablet, Rfl: 0 .  benzonatate (TESSALON) 200 MG capsule, Take 1 capsule (200 mg total) by mouth 3 (three) times daily as needed for cough. (Patient not taking: Reported on 08/29/2016), Disp: 21 capsule, Rfl: 0  Allergies  Allergen Reactions  . Latex      Review of Systems  Musculoskeletal: Positive for back pain and neck pain.  Neurological: Positive for paresthesias (in feet mainly after standing.). Negative for tingling and numbness.   Please see history of present illness for a complete list of ROS Objective  Vitals:   08/29/16 0929  BP: 122/67  Pulse: 83  Resp: 17  Temp: 99.5 F (37.5 C)  TempSrc: Oral  SpO2: 96%  Weight: 194 lb 14.4 oz (88.4 kg)  Height: 5\' 9"  (1.753 m)    Physical Exam  Constitutional: He is oriented to person, place, and time and well-developed, well-nourished, and in no distress.  HENT:  Head: Normocephalic and atraumatic.  Cardiovascular:  Normal rate, regular rhythm and normal heart sounds.   No murmur heard. Pulmonary/Chest: Effort normal and breath sounds normal. He has no wheezes.  Musculoskeletal:       Cervical back: He exhibits tenderness, pain and spasm.       Lumbar back: He exhibits tenderness, pain and spasm.       Back:  Neurological: He is alert and oriented to person, place, and time.  Psychiatric: Mood, memory, affect and judgment normal.  Nursing note and vitals reviewed.     Recent Results (from the past 2160 hour(s))  POCT rapid strep A     Status: None   Collection Time: 07/18/16  9:51 AM  Result Value Ref Range   Rapid Strep A Screen Negative Negative     Assessment & Plan  1. Chronic midline low back pain with bilateral sciatica Obtain x-rays for evaluation of possible arthritis, informed him that it may be necessary to proceed with an MRI after review of x-rays given his radicular symptoms. - DG Lumbar Spine Complete; Future - meloxicam (MOBIC) 15 MG tablet; Take 1 tablet (15 mg total) by mouth daily.  Dispense: 30 tablet; Refill: 0  2. Cervical radiculopathy, chronic Started on tizanidine for relief of muscle spasm, obtain x-rays for evaluation of possible degenerative disc disease of the cervical spine - DG Cervical Spine Complete; Future - tiZANidine (ZANAFLEX) 4 MG capsule; Take 1 capsule (4 mg total) by mouth 3 (three) times daily as needed for muscle spasms.  Dispense: 30 capsule; Refill: 0  3. Hypercholesteremia  - Lipid Profile - COMPLETE METABOLIC PANEL WITH GFR  4. Need for immunization against influenza  - Flu Vaccine QUAD 36+ mos PF IM (Fluarix & Fluzone Quad PF)  Princella Jaskiewicz Asad A. Faylene KurtzShah [facility] 08/29/2016 9:52 AM

## 2016-08-30 ENCOUNTER — Ambulatory Visit
Admission: RE | Admit: 2016-08-30 | Discharge: 2016-08-30 | Disposition: A | Discharge status: Home / Self Care | Payer: BLUE CROSS/BLUE SHIELD | Source: Ambulatory Visit | Attending: Family Medicine | Admitting: Family Medicine

## 2016-08-30 DIAGNOSIS — G8929 Other chronic pain: Secondary | ICD-10-CM | POA: Diagnosis not present

## 2016-08-30 DIAGNOSIS — M5442 Lumbago with sciatica, left side: Secondary | ICD-10-CM | POA: Insufficient documentation

## 2016-08-30 DIAGNOSIS — M4802 Spinal stenosis, cervical region: Secondary | ICD-10-CM | POA: Diagnosis not present

## 2016-08-30 DIAGNOSIS — I700 Atherosclerosis of aorta: Secondary | ICD-10-CM | POA: Insufficient documentation

## 2016-08-30 DIAGNOSIS — M47892 Other spondylosis, cervical region: Secondary | ICD-10-CM | POA: Diagnosis not present

## 2016-08-30 DIAGNOSIS — M5441 Lumbago with sciatica, right side: Secondary | ICD-10-CM | POA: Diagnosis not present

## 2016-08-30 DIAGNOSIS — M4186 Other forms of scoliosis, lumbar region: Secondary | ICD-10-CM | POA: Diagnosis not present

## 2016-08-30 DIAGNOSIS — I708 Atherosclerosis of other arteries: Secondary | ICD-10-CM | POA: Diagnosis not present

## 2016-08-30 DIAGNOSIS — M5412 Radiculopathy, cervical region: Secondary | ICD-10-CM | POA: Insufficient documentation

## 2016-08-30 DIAGNOSIS — M47896 Other spondylosis, lumbar region: Secondary | ICD-10-CM | POA: Diagnosis not present

## 2016-08-30 DIAGNOSIS — I779 Disorder of arteries and arterioles, unspecified: Secondary | ICD-10-CM | POA: Diagnosis not present

## 2016-08-30 LAB — LIPID PANEL
Cholesterol: 194 mg/dL (ref <200)
HDL: 57 mg/dL (ref >40)
LDL Cholesterol: 116 mg/dL — ABNORMAL HIGH (ref <100)
Total CHOL/HDL Ratio: 3.4 Ratio (ref <5.0)
Triglycerides: 107 mg/dL (ref <150)
VLDL: 21 mg/dL (ref <30)

## 2016-08-30 LAB — COMPLETE METABOLIC PANEL WITH GFR
ALT: 20 U/L (ref 9–46)
AST: 23 U/L (ref 10–35)
Albumin: 4.2 g/dL (ref 3.6–5.1)
Alkaline Phosphatase: 55 U/L (ref 40–115)
BUN: 17 mg/dL (ref 7–25)
CO2: 30 mmol/L (ref 20–31)
Calcium: 9.1 mg/dL (ref 8.6–10.3)
Chloride: 104 mmol/L (ref 98–110)
Creat: 1.11 mg/dL (ref 0.70–1.33)
GFR, Est African American: 84 mL/min (ref >=60)
GFR, Est Non African American: 73 mL/min (ref >=60)
Glucose, Bld: 88 mg/dL (ref 65–99)
Potassium: 4.8 mmol/L (ref 3.5–5.3)
Sodium: 139 mmol/L (ref 135–146)
Total Bilirubin: 0.6 mg/dL (ref 0.2–1.2)
Total Protein: 6.9 g/dL (ref 6.1–8.1)

## 2016-08-31 ENCOUNTER — Other Ambulatory Visit: Payer: Self-pay | Admitting: Family Medicine

## 2016-08-31 DIAGNOSIS — N200 Calculus of kidney: Secondary | ICD-10-CM | POA: Insufficient documentation

## 2016-09-17 ENCOUNTER — Other Ambulatory Visit: Payer: Self-pay | Admitting: Family Medicine

## 2016-09-17 DIAGNOSIS — N200 Calculus of kidney: Secondary | ICD-10-CM

## 2016-09-18 ENCOUNTER — Telehealth: Payer: Self-pay | Admitting: Family Medicine

## 2016-09-18 ENCOUNTER — Ambulatory Visit: Payer: BLUE CROSS/BLUE SHIELD

## 2016-09-18 ENCOUNTER — Other Ambulatory Visit: Payer: BLUE CROSS/BLUE SHIELD

## 2016-09-18 ENCOUNTER — Ambulatory Visit: Admission: RE | Admit: 2016-09-18 | Payer: BLUE CROSS/BLUE SHIELD | Source: Ambulatory Visit

## 2016-09-18 NOTE — Telephone Encounter (Signed)
[facility] abdomen without contrast fax to [phone] pt has appt tomorrow 09/19/16 (p) [phone] option 5

## 2016-09-20 NOTE — Telephone Encounter (Signed)
Orders for CT scan has been placed and scan has been completed and resulted in care everywhere tab

## 2016-09-28 ENCOUNTER — Encounter: Payer: Self-pay | Admitting: Family Medicine

## 2017-08-16 ENCOUNTER — Telehealth: Payer: Self-pay | Admitting: Gastroenterology

## 2017-08-16 NOTE — Telephone Encounter (Signed)
Patient

## 2020-01-07 ENCOUNTER — Ambulatory Visit: Payer: BLUE CROSS/BLUE SHIELD | Attending: Internal Medicine

## 2020-01-07 DIAGNOSIS — Z23 Encounter for immunization: Secondary | ICD-10-CM

## 2020-01-07 NOTE — Progress Notes (Signed)
   Covid-19 Vaccination Clinic  Name:  Sean Bailey    MRN: [id number] DOB: [birth year]  01/07/2020  [name] was observed post Covid-19 immunization for 15 minutes without incident. He was provided with Vaccine Information Sheet and instruction to access the V-Safe system.   [name] was instructed to call 911 with any severe reactions post vaccine: Marland Kitchen Difficulty breathing  . Swelling of face and throat  . A fast heartbeat  . A bad rash all over body  . Dizziness and weakness   Immunizations Administered    Name Date Dose VIS Date Route   Pfizer COVID-19 Vaccine 01/07/2020  9:55 AM 0.3 mL 09/11/2019 Intramuscular   Manufacturer: [employer], [employer]   Lot: [id number]   NDC: [address]-4

## 2020-02-02 ENCOUNTER — Ambulatory Visit: Payer: BLUE CROSS/BLUE SHIELD | Attending: Internal Medicine

## 2020-02-02 DIAGNOSIS — Z23 Encounter for immunization: Secondary | ICD-10-CM

## 2020-02-02 NOTE — Progress Notes (Signed)
   Covid-19 Vaccination Clinic  Name:  Sean Bailey    MRN: [id number] DOB: Mar 02, 1956  02/02/2020  [name] was observed post Covid-19 immunization for 15 minutes without incident. He was provided with Vaccine Information Sheet and instruction to access the V-Safe system.   [name] was instructed to call 911 with any severe reactions post vaccine: Marland Kitchen Difficulty breathing  . Swelling of face and throat  . A fast heartbeat  . A bad rash all over body  . Dizziness and weakness   Immunizations Administered    Name Date Dose VIS Date Route   Pfizer COVID-19 Vaccine 02/02/2020  9:44 AM 0.3 mL 11/25/2018 Intramuscular   Manufacturer: [employer], [employer]   Lot: [id number]   NDC: [address]-0

## 2020-07-20 ENCOUNTER — Other Ambulatory Visit
Admission: RE | Admit: 2020-07-20 | Discharge: 2020-07-20 | Disposition: A | Discharge status: Home / Self Care | Payer: 59 | Source: Ambulatory Visit | Attending: Gastroenterology | Admitting: Gastroenterology

## 2020-07-20 ENCOUNTER — Other Ambulatory Visit: Payer: Self-pay

## 2020-07-20 DIAGNOSIS — Z01812 Encounter for preprocedural laboratory examination: Secondary | ICD-10-CM | POA: Diagnosis present

## 2020-07-20 DIAGNOSIS — Z20822 Contact with and (suspected) exposure to covid-19: Secondary | ICD-10-CM | POA: Insufficient documentation

## 2020-07-20 LAB — SARS CORONAVIRUS 2 (TAT 6-24 HRS): SARS Coronavirus 2: NEGATIVE

## 2020-07-22 ENCOUNTER — Ambulatory Visit: Payer: 59 | Admitting: Certified Registered"

## 2020-07-22 ENCOUNTER — Other Ambulatory Visit: Payer: Self-pay

## 2020-07-22 ENCOUNTER — Ambulatory Visit
Admission: RE | Admit: 2020-07-22 | Discharge: 2020-07-22 | Disposition: A | Discharge status: Home / Self Care | Payer: 59 | Attending: Gastroenterology | Admitting: Gastroenterology

## 2020-07-22 ENCOUNTER — Encounter: Payer: Self-pay | Admitting: *Deleted

## 2020-07-22 ENCOUNTER — Encounter
Admission: RE | Disposition: A | Discharge status: Home / Self Care | Payer: Self-pay | Source: Home / Self Care | Attending: Gastroenterology

## 2020-07-22 DIAGNOSIS — Z791 Long term (current) use of non-steroidal anti-inflammatories (NSAID): Secondary | ICD-10-CM | POA: Diagnosis not present

## 2020-07-22 DIAGNOSIS — I10 Essential (primary) hypertension: Secondary | ICD-10-CM | POA: Insufficient documentation

## 2020-07-22 DIAGNOSIS — K640 First degree hemorrhoids: Secondary | ICD-10-CM | POA: Insufficient documentation

## 2020-07-22 DIAGNOSIS — Z79899 Other long term (current) drug therapy: Secondary | ICD-10-CM | POA: Diagnosis not present

## 2020-07-22 DIAGNOSIS — Z7982 Long term (current) use of aspirin: Secondary | ICD-10-CM | POA: Insufficient documentation

## 2020-07-22 DIAGNOSIS — Z1211 Encounter for screening for malignant neoplasm of colon: Secondary | ICD-10-CM | POA: Diagnosis not present

## 2020-07-22 HISTORY — PX: COLONOSCOPY WITH PROPOFOL: SHX5780

## 2020-07-22 SURGERY — COLONOSCOPY WITH PROPOFOL
Anesthesia: General

## 2020-07-22 MED ORDER — LIDOCAINE HCL (CARDIAC) PF 100 MG/5ML IV SOSY
PREFILLED_SYRINGE | INTRAVENOUS | Status: DC | PRN
  Administered 2020-07-22: 100 mg via INTRAVENOUS

## 2020-07-22 MED ORDER — PROPOFOL 10 MG/ML IV BOLUS
INTRAVENOUS | Status: DC | PRN
  Administered 2020-07-22: 60 mg via INTRAVENOUS

## 2020-07-22 MED ORDER — SODIUM CHLORIDE 0.9 % IV SOLN: INTRAVENOUS | Status: DC

## 2020-07-22 MED ORDER — PROPOFOL 10 MG/ML IV BOLUS
INTRAVENOUS | Status: DC | PRN
  Administered 2020-07-22: 165 ug/kg/min via INTRAVENOUS

## 2020-07-22 MED ORDER — GLYCOPYRROLATE 0.2 MG/ML IJ SOLN
INTRAMUSCULAR | Status: DC | PRN
  Administered 2020-07-22: .2 mg via INTRAVENOUS

## 2020-07-22 NOTE — Interval H&P Note (Signed)
History and Physical Interval Note:  07/22/2020 8:28 AM  Sean Bailey  has presented today for surgery, with the diagnosis of SCREEN.  The various methods of treatment have been discussed with the patient and family. After consideration of risks, benefits and other options for treatment, the patient has consented to  Procedure(s): COLONOSCOPY WITH PROPOFOL (N/A) as a surgical intervention.  The patient's history has been reviewed, patient examined, no change in status, stable for surgery.  I have reviewed the patient's chart and labs.  Questions were answered to the patient's satisfaction.     Regis Bill  Ok to proceed with colonoscopy

## 2020-07-22 NOTE — H&P (Signed)
Outpatient short stay form Pre-procedure 07/22/2020 8:26 AM Sean Lot MD, MPH  Primary Physician: [name]  Reason for visit:  Screening  History of present illness:   64 y/o gentleman here for screening colonoscopy. No family history of GI malignancies. No blood thinners. History of inguinal hernia repair. Last colonoscopy 10 years ago was normal.   No current facility-administered medications for this encounter.  Medications Prior to Admission  Medication Sig Dispense Refill Last Dose  . calcipotriene (DOVONOX) 0.005 % cream Apply topically.   Past Month at Unknown time  . Cetirizine HCl 10 MG CAPS Take 10 mg by mouth daily as needed.    Past Week at Unknown time  . LACTASE PO Take by mouth.   Past Week at Unknown time  . MULTIPLE VITAMIN PO Take by mouth.   Past Week at Unknown time  . propranolol (INDERAL) 80 MG tablet Take 80 mg by mouth 2 (two) times daily.   07/21/2020 at 0900  . tiZANidine (ZANAFLEX) 4 MG capsule Take 1 capsule (4 mg total) by mouth 3 (three) times daily as needed for muscle spasms. 30 capsule 0 Past Week at Unknown time  . acetaminophen (TYLENOL 8 HOUR ARTHRITIS PAIN) 650 MG CR tablet Take 1,300 mg by mouth daily as needed for pain.     Marland Kitchen aspirin 81 MG tablet Take by mouth.     . Cholecalciferol (VITAMIN D) 2000 UNITS tablet Take by mouth.     . meloxicam (MOBIC) 15 MG tablet Take 1 tablet (15 mg total) by mouth daily. 30 tablet 0      Allergies  Allergen Reactions  . Latex      Past Medical History:  Diagnosis Date  . Anxiety   . Hemorrhoid   . Hyperlipidemia   . Hypertension   . Tremors of nervous system     Review of systems:  Otherwise negative.    Physical Exam  Gen: Alert, oriented. Appears stated age.  HEENT: PERRLA. Lungs: No respiratory distress CV: RRR Abd: soft, benign, no masses.  Ext: No edema. Pulses 2+    Planned procedures: Proceed with colonoscopy. The patient understands the nature of the planned procedure,  indications, risks, alternatives and potential complications including but not limited to bleeding, infection, perforation, damage to internal organs and possible oversedation/side effects from anesthesia. The patient agrees and gives consent to proceed.  Please refer to procedure notes for findings, recommendations and patient disposition/instructions.     Sean Lot MD, MPH Gastroenterology 07/22/2020  8:26 AM

## 2020-07-22 NOTE — Anesthesia Postprocedure Evaluation (Signed)
Anesthesia Post Note  Patient: Sean Bailey  Procedure(s) Performed: COLONOSCOPY WITH PROPOFOL (N/A )  Patient location during evaluation: Endoscopy Anesthesia Type: General Level of consciousness: awake and alert and oriented Pain management: pain level controlled Vital Signs Assessment: post-procedure vital signs reviewed and stable Respiratory status: spontaneous breathing, nonlabored ventilation and respiratory function stable Cardiovascular status: blood pressure returned to baseline and stable Postop Assessment: no signs of nausea or vomiting Anesthetic complications: no   No complications documented.   Last Vitals:  Vitals:   07/22/20 0910 07/22/20 0920  BP: 108/75 137/87  Pulse: 88 75  Resp: 20 20  Temp:    SpO2: 100% 96%    Last Pain:  Vitals:   07/22/20 0809  TempSrc: Temporal  PainSc: 0-No pain                 Desi Carby

## 2020-07-22 NOTE — Anesthesia Preprocedure Evaluation (Signed)
Anesthesia Evaluation  Patient identified by MRN, date of birth, ID band Patient awake    Reviewed: Allergy & Precautions, NPO status , Patient's Chart, lab work & pertinent test results  History of Anesthesia Complications Negative for: history of anesthetic complications  Airway Mallampati: II  TM Distance: >3 FB Neck ROM: Full    Dental  (+) Poor Dentition   Pulmonary neg pulmonary ROS, neg sleep apnea, neg COPD,    breath sounds clear to auscultation- rhonchi (-) wheezing      Cardiovascular hypertension, Pt. on medications (-) CAD, (-) Past MI, (-) Cardiac Stents and (-) CABG  Rhythm:Regular Rate:Normal - Systolic murmurs and - Diastolic murmurs    Neuro/Psych neg Seizures PSYCHIATRIC DISORDERS Anxiety Depression negative neurological ROS     GI/Hepatic negative GI ROS, Neg liver ROS,   Endo/Other  negative endocrine ROSneg diabetes  Renal/GU Renal disease: hx of nephrolithiasis.     Musculoskeletal negative musculoskeletal ROS (+)   Abdominal (+) - obese,   Peds  Hematology negative hematology ROS (+)   Anesthesia Other Findings Past Medical History: No date: Anxiety No date: Hemorrhoid No date: Hyperlipidemia No date: Hypertension No date: Tremors of nervous system   Reproductive/Obstetrics                             Anesthesia Physical Anesthesia Plan  ASA: II  Anesthesia Plan: General   Post-op Pain Management:    Induction: Intravenous  PONV Risk Score and Plan: 1 and Propofol infusion  Airway Management Planned: Natural Airway  Additional Equipment:   Intra-op Plan:   Post-operative Plan:   Informed Consent: I have reviewed the patients History and Physical, chart, labs and discussed the procedure including the risks, benefits and alternatives for the proposed anesthesia with the patient or authorized representative who has indicated his/her understanding and  acceptance.     Dental advisory given  Plan Discussed with: CRNA and Anesthesiologist  Anesthesia Plan Comments:         Anesthesia Quick Evaluation

## 2020-07-22 NOTE — Transfer of Care (Signed)
Immediate Anesthesia Transfer of Care Note  Patient: Sean Bailey  Procedure(s) Performed: COLONOSCOPY WITH PROPOFOL (N/A )  Patient Location: Endoscopy Unit  Anesthesia Type:General  Level of Consciousness: drowsy, patient cooperative and responds to stimulation  Airway & Oxygen Therapy: Patient Spontanous Breathing and Patient connected to face mask oxygen  Post-op Assessment: Report given to RN and Post -op Vital signs reviewed and stable  Post vital signs: Reviewed and stable  Last Vitals:  Vitals Value Taken Time  BP 89/66 07/22/20 0854  Temp    Pulse 93 07/22/20 0855  Resp 19 07/22/20 0855  SpO2 100 % 07/22/20 0855  Vitals shown include unvalidated device data.  Last Pain:  Vitals:   07/22/20 0809  TempSrc: Temporal  PainSc: 0-No pain         Complications: No complications documented.

## 2020-07-22 NOTE — Op Note (Signed)
[facility] Gastroenterology Patient Name: Sean Bailey Procedure Date: 07/22/2020 8:34 AM MRN: [id number] Account #: [id number] Date of Birth: 03-May-1956 Admit Type: Outpatient Age: 64 Room: [facility] ENDO ROOM 3 Gender: Male Note Status: Finalized Procedure:             Colonoscopy Indications:           Screening for colorectal malignant neoplasm Providers:             Andrey Farmer MD, MD Referring MD:          No Local Md, MD (Referring MD) Medicines:             Monitored Anesthesia Care Complications:         No immediate complications. Procedure:             Pre-Anesthesia Assessment:                        - Prior to the procedure, a History and Physical was                         performed, and patient medications and allergies were                         reviewed. The patient is competent. The risks and                         benefits of the procedure and the sedation options and                         risks were discussed with the patient. All questions                         were answered and informed consent was obtained.                         Patient identification and proposed procedure were                         verified by the physician, the nurse, the anesthetist                         and the technician in the endoscopy suite. Mental                         Status Examination: alert and oriented. Airway                         Examination: normal oropharyngeal airway and neck                         mobility. Respiratory Examination: clear to                         auscultation. CV Examination: normal. Prophylactic                         Antibiotics: The patient does not require prophylactic  antibiotics. Prior Anticoagulants: The patient has                         taken no previous anticoagulant or antiplatelet                         agents. ASA Grade Assessment: II - A patient with mild                          systemic disease. After reviewing the risks and                         benefits, the patient was deemed in satisfactory                         condition to undergo the procedure. The anesthesia                         plan was to use monitored anesthesia care (MAC).                         Immediately prior to administration of medications,                         the patient was re-assessed for adequacy to receive                         sedatives. The heart rate, respiratory rate, oxygen                         saturations, blood pressure, adequacy of pulmonary                         ventilation, and response to care were monitored                         throughout the procedure. The physical status of the                         patient was re-assessed after the procedure.                        After obtaining informed consent, the colonoscope was                         passed under direct vision. Throughout the procedure,                         the patient's blood pressure, pulse, and oxygen                         saturations were monitored continuously. The                         Colonoscope was introduced through the anus and                         advanced to the the cecum, identified by appendiceal  orifice and ileocecal valve. The colonoscopy was                         performed without difficulty. The patient tolerated                         the procedure well. The quality of the bowel                         preparation was good. Findings:      The perianal and digital rectal examinations were normal.      Non-bleeding internal hemorrhoids were found during retroflexion. The       hemorrhoids were Grade I (internal hemorrhoids that do not prolapse).      The exam was otherwise without abnormality on direct and retroflexion       views. Impression:            - Non-bleeding internal hemorrhoids.                        - The examination  was otherwise normal on direct and                         retroflexion views.                        - No specimens collected. Recommendation:        - Discharge patient to home.                        - Resume previous diet.                        - Continue present medications.                        - Repeat colonoscopy in 10 years for screening                         purposes.                        - Return to referring physician as previously                         scheduled. Procedure Code(s):     --- Professional ---                        W4315, Colorectal cancer screening; colonoscopy on                         individual not meeting criteria for high risk Diagnosis Code(s):     --- Professional ---                        Z12.11, Encounter for screening for malignant neoplasm                         of colon                        K64.0, First  degree hemorrhoids CPT copyright 2019 American Medical Association. All rights reserved. The codes documented in this report are preliminary and upon coder review may  be revised to meet current compliance requirements. Andrey Farmer, MD Andrey Farmer MD, MD 07/22/2020 8:52:50 AM Number of Addenda: 0 Note Initiated On: 07/22/2020 8:34 AM Scope Withdrawal Time: 0 hours 5 minutes 42 seconds  Total Procedure Duration: 0 hours 10 minutes 25 seconds  Estimated Blood Loss:  Estimated blood loss: none.      [facility]

## 2021-05-02 ENCOUNTER — Other Ambulatory Visit: Payer: Self-pay

## 2021-05-02 ENCOUNTER — Emergency Department: Payer: 59

## 2021-05-02 ENCOUNTER — Inpatient Hospital Stay
Admission: EM | Admit: 2021-05-02 | Discharge: 2021-05-05 | DRG: 871 | Disposition: A | Discharge status: Home / Self Care | Payer: 59 | Attending: Internal Medicine | Admitting: Internal Medicine

## 2021-05-02 DIAGNOSIS — R778 Other specified abnormalities of plasma proteins: Secondary | ICD-10-CM | POA: Diagnosis present

## 2021-05-02 DIAGNOSIS — I248 Other forms of acute ischemic heart disease: Secondary | ICD-10-CM | POA: Diagnosis present

## 2021-05-02 DIAGNOSIS — Z823 Family history of stroke: Secondary | ICD-10-CM | POA: Diagnosis not present

## 2021-05-02 DIAGNOSIS — Z808 Family history of malignant neoplasm of other organs or systems: Secondary | ICD-10-CM

## 2021-05-02 DIAGNOSIS — J1282 Pneumonia due to coronavirus disease 2019: Secondary | ICD-10-CM | POA: Diagnosis present

## 2021-05-02 DIAGNOSIS — A4189 Other specified sepsis: Secondary | ICD-10-CM | POA: Diagnosis present

## 2021-05-02 DIAGNOSIS — Z91040 Latex allergy status: Secondary | ICD-10-CM

## 2021-05-02 DIAGNOSIS — Z7982 Long term (current) use of aspirin: Secondary | ICD-10-CM

## 2021-05-02 DIAGNOSIS — G250 Essential tremor: Secondary | ICD-10-CM | POA: Diagnosis present

## 2021-05-02 DIAGNOSIS — J159 Unspecified bacterial pneumonia: Secondary | ICD-10-CM | POA: Diagnosis present

## 2021-05-02 DIAGNOSIS — I10 Essential (primary) hypertension: Secondary | ICD-10-CM | POA: Diagnosis present

## 2021-05-02 DIAGNOSIS — E785 Hyperlipidemia, unspecified: Secondary | ICD-10-CM | POA: Diagnosis present

## 2021-05-02 DIAGNOSIS — Z818 Family history of other mental and behavioral disorders: Secondary | ICD-10-CM | POA: Diagnosis not present

## 2021-05-02 DIAGNOSIS — Z79899 Other long term (current) drug therapy: Secondary | ICD-10-CM

## 2021-05-02 DIAGNOSIS — U071 COVID-19: Secondary | ICD-10-CM | POA: Diagnosis present

## 2021-05-02 DIAGNOSIS — R7989 Other specified abnormal findings of blood chemistry: Secondary | ICD-10-CM | POA: Diagnosis present

## 2021-05-02 DIAGNOSIS — A0839 Other viral enteritis: Secondary | ICD-10-CM | POA: Diagnosis present

## 2021-05-02 DIAGNOSIS — Z91030 Bee allergy status: Secondary | ICD-10-CM | POA: Diagnosis not present

## 2021-05-02 DIAGNOSIS — J9601 Acute respiratory failure with hypoxia: Secondary | ICD-10-CM | POA: Diagnosis present

## 2021-05-02 DIAGNOSIS — Z811 Family history of alcohol abuse and dependence: Secondary | ICD-10-CM | POA: Diagnosis not present

## 2021-05-02 DIAGNOSIS — Z8042 Family history of malignant neoplasm of prostate: Secondary | ICD-10-CM | POA: Diagnosis not present

## 2021-05-02 DIAGNOSIS — L409 Psoriasis, unspecified: Secondary | ICD-10-CM | POA: Diagnosis present

## 2021-05-02 DIAGNOSIS — A419 Sepsis, unspecified organism: Secondary | ICD-10-CM | POA: Diagnosis present

## 2021-05-02 DIAGNOSIS — R251 Tremor, unspecified: Secondary | ICD-10-CM | POA: Diagnosis not present

## 2021-05-02 LAB — CBC WITH DIFFERENTIAL/PLATELET
Abs Immature Granulocytes: 0.04 10*3/uL (ref 0.00–0.07)
Basophils Absolute: 0 10*3/uL (ref 0.0–0.1)
Basophils Relative: 0 %
Eosinophils Absolute: 0.1 10*3/uL (ref 0.0–0.5)
Eosinophils Relative: 0 %
HCT: 38.7 % — ABNORMAL LOW (ref 39.0–52.0)
Hemoglobin: 13.6 g/dL (ref 13.0–17.0)
Immature Granulocytes: 0 %
Lymphocytes Relative: 6 %
Lymphs Abs: 0.8 10*3/uL (ref 0.7–4.0)
MCH: 33.1 pg (ref 26.0–34.0)
MCHC: 35.1 g/dL (ref 30.0–36.0)
MCV: 94.2 fL (ref 80.0–100.0)
Monocytes Absolute: 1 10*3/uL (ref 0.1–1.0)
Monocytes Relative: 7 %
Neutro Abs: 11.8 10*3/uL — ABNORMAL HIGH (ref 1.7–7.7)
Neutrophils Relative %: 87 %
Platelets: 179 10*3/uL (ref 150–400)
RBC: 4.11 MIL/uL — ABNORMAL LOW (ref 4.22–5.81)
RDW: 14.6 % (ref 11.5–15.5)
Smear Review: NORMAL
WBC: 13.7 10*3/uL — ABNORMAL HIGH (ref 4.0–10.5)
nRBC: 0 % (ref 0.0–0.2)

## 2021-05-02 LAB — EXPECTORATED SPUTUM ASSESSMENT W GRAM STAIN, RFLX TO RESP C

## 2021-05-02 LAB — LACTIC ACID, PLASMA
Lactic Acid, Venous: 1.4 mmol/L (ref 0.5–1.9)
Lactic Acid, Venous: 1.5 mmol/L (ref 0.5–1.9)

## 2021-05-02 LAB — COMPREHENSIVE METABOLIC PANEL
ALT: 21 U/L (ref 0–44)
AST: 28 U/L (ref 15–41)
Albumin: 3.3 g/dL — ABNORMAL LOW (ref 3.5–5.0)
Alkaline Phosphatase: 49 U/L (ref 38–126)
Anion gap: 6 (ref 5–15)
BUN: 15 mg/dL (ref 8–23)
CO2: 23 mmol/L (ref 22–32)
Calcium: 8.6 mg/dL — ABNORMAL LOW (ref 8.9–10.3)
Chloride: 107 mmol/L (ref 98–111)
Creatinine, Ser: 1.23 mg/dL (ref 0.61–1.24)
GFR, Estimated: >60 mL/min (ref >60)
Glucose, Bld: 130 mg/dL — ABNORMAL HIGH (ref 70–99)
Potassium: 3.6 mmol/L (ref 3.5–5.1)
Sodium: 136 mmol/L (ref 135–145)
Total Bilirubin: 1.1 mg/dL (ref 0.3–1.2)
Total Protein: 6.7 g/dL (ref 6.5–8.1)

## 2021-05-02 LAB — MAGNESIUM: Magnesium: 1.6 mg/dL — ABNORMAL LOW (ref 1.7–2.4)

## 2021-05-02 LAB — TROPONIN I (HIGH SENSITIVITY)
Troponin I (High Sensitivity): 54 ng/L — ABNORMAL HIGH (ref <18)
Troponin I (High Sensitivity): 54 ng/L — ABNORMAL HIGH (ref <18)
Troponin I (High Sensitivity): 49 ng/L — ABNORMAL HIGH (ref <18)
Troponin I (High Sensitivity): 37 ng/L — ABNORMAL HIGH (ref <18)

## 2021-05-02 LAB — PROCALCITONIN: Procalcitonin: 4.53 ng/mL

## 2021-05-02 LAB — D-DIMER, QUANTITATIVE: D-Dimer, Quant: 0.77 ug/mL-FEU — ABNORMAL HIGH (ref 0.00–0.50)

## 2021-05-02 LAB — C-REACTIVE PROTEIN: CRP: 27 mg/dL — ABNORMAL HIGH (ref <1.0)

## 2021-05-02 MED ORDER — SODIUM CHLORIDE 0.9 % IV SOLN
500.0000 mg | Freq: Once | INTRAVENOUS | Status: AC
  Administered 2021-05-02: 500 mg via INTRAVENOUS
  Filled 2021-05-02: qty 500

## 2021-05-02 MED ORDER — DM-GUAIFENESIN ER 30-600 MG PO TB12
1.0000 | ORAL_TABLET | Freq: Two times a day (BID) | ORAL | Status: DC
  Administered 2021-05-02 – 2021-05-03 (×2): 1 via ORAL
  Filled 2021-05-02 (×2): qty 1

## 2021-05-02 MED ORDER — METHYLPREDNISOLONE SODIUM SUCC 40 MG IJ SOLR: 40.0000 mg | Freq: Two times a day (BID) | INTRAMUSCULAR | Status: DC

## 2021-05-02 MED ORDER — SODIUM CHLORIDE 0.9 % IV SOLN
1.0000 g | INTRAVENOUS | Status: DC
  Administered 2021-05-03 – 2021-05-04 (×2): 1 g via INTRAVENOUS
  Filled 2021-05-02 (×2): qty 10
  Filled 2021-05-02: qty 1

## 2021-05-02 MED ORDER — IOHEXOL 350 MG/ML SOLN
75.0000 mL | Freq: Once | INTRAVENOUS | Status: AC | PRN
  Administered 2021-05-02: 75 mL via INTRAVENOUS
  Filled 2021-05-02: qty 75

## 2021-05-02 MED ORDER — FOLIC ACID 1 MG PO TABS
1.0000 mg | ORAL_TABLET | Freq: Every day | ORAL | Status: DC
  Administered 2021-05-03 – 2021-05-05 (×3): 1 mg via ORAL
  Filled 2021-05-02 (×3): qty 1

## 2021-05-02 MED ORDER — CALCIPOTRIENE 0.005 % EX CREA: TOPICAL_CREAM | Freq: Every morning | CUTANEOUS | Status: DC

## 2021-05-02 MED ORDER — ONDANSETRON HCL 4 MG/2ML IJ SOLN: 4.0000 mg | Freq: Three times a day (TID) | INTRAMUSCULAR | Status: DC | PRN

## 2021-05-02 MED ORDER — SODIUM CHLORIDE 0.9 % IV SOLN
200.0000 mg | Freq: Once | INTRAVENOUS | Status: AC
  Administered 2021-05-02: 200 mg via INTRAVENOUS
  Filled 2021-05-02: qty 40

## 2021-05-02 MED ORDER — SODIUM CHLORIDE 0.9 % IV SOLN
100.0000 mg | Freq: Every day | INTRAVENOUS | Status: DC
  Administered 2021-05-03 – 2021-05-05 (×3): 100 mg via INTRAVENOUS
  Filled 2021-05-02 (×2): qty 100
  Filled 2021-05-02: qty 20

## 2021-05-02 MED ORDER — SODIUM CHLORIDE 0.9 % IV SOLN
1.0000 g | Freq: Once | INTRAVENOUS | Status: AC
  Administered 2021-05-02: 1 g via INTRAVENOUS
  Filled 2021-05-02: qty 10

## 2021-05-02 MED ORDER — ACETAMINOPHEN 500 MG PO TABS
1000.0000 mg | ORAL_TABLET | Freq: Once | ORAL | Status: AC
  Administered 2021-05-02: 1000 mg via ORAL
  Filled 2021-05-02: qty 2

## 2021-05-02 MED ORDER — PROPRANOLOL HCL 40 MG PO TABS
80.0000 mg | ORAL_TABLET | Freq: Two times a day (BID) | ORAL | Status: DC
  Administered 2021-05-02 – 2021-05-05 (×6): 80 mg via ORAL
  Filled 2021-05-02 (×7): qty 2

## 2021-05-02 MED ORDER — MAGNESIUM SULFATE 4 GM/100ML IV SOLN
4.0000 g | Freq: Once | INTRAVENOUS | Status: AC
  Administered 2021-05-02: 4 g via INTRAVENOUS
  Filled 2021-05-02: qty 100

## 2021-05-02 MED ORDER — ASPIRIN 81 MG PO CHEW
324.0000 mg | CHEWABLE_TABLET | Freq: Once | ORAL | Status: AC
  Administered 2021-05-02: 324 mg via ORAL
  Filled 2021-05-02: qty 4

## 2021-05-02 MED ORDER — IPRATROPIUM BROMIDE HFA 17 MCG/ACT IN AERS
2.0000 | INHALATION_SPRAY | RESPIRATORY_TRACT | Status: DC
  Administered 2021-05-02 – 2021-05-05 (×17): 2 via RESPIRATORY_TRACT
  Filled 2021-05-02 (×2): qty 12.9

## 2021-05-02 MED ORDER — METHYLPREDNISOLONE SODIUM SUCC 40 MG IJ SOLR
40.0000 mg | Freq: Two times a day (BID) | INTRAMUSCULAR | Status: DC
  Administered 2021-05-03: 40 mg via INTRAVENOUS
  Filled 2021-05-02: qty 1

## 2021-05-02 MED ORDER — SODIUM CHLORIDE 0.9 % IV SOLN
500.0000 mg | INTRAVENOUS | Status: DC
  Administered 2021-05-03 – 2021-05-04 (×2): 500 mg via INTRAVENOUS
  Filled 2021-05-02 (×3): qty 500

## 2021-05-02 MED ORDER — DEXAMETHASONE SODIUM PHOSPHATE 10 MG/ML IJ SOLN
6.0000 mg | Freq: Once | INTRAMUSCULAR | Status: DC
  Administered 2021-05-02: 6 mg via INTRAVENOUS
  Filled 2021-05-02: qty 1

## 2021-05-02 MED ORDER — ACETAMINOPHEN 160 MG/5ML PO SOLN
650.0000 mg | Freq: Four times a day (QID) | ORAL | Status: DC | PRN
  Filled 2021-05-02: qty 20.3

## 2021-05-02 MED ORDER — ALBUTEROL SULFATE HFA 108 (90 BASE) MCG/ACT IN AERS
2.0000 | INHALATION_SPRAY | RESPIRATORY_TRACT | Status: DC | PRN
  Filled 2021-05-02: qty 6.7

## 2021-05-02 MED ORDER — ENOXAPARIN SODIUM 40 MG/0.4ML IJ SOSY
40.0000 mg | PREFILLED_SYRINGE | INTRAMUSCULAR | Status: DC
  Administered 2021-05-02 – 2021-05-04 (×3): 40 mg via SUBCUTANEOUS
  Filled 2021-05-02 (×3): qty 0.4

## 2021-05-02 MED ORDER — LACTATED RINGERS IV BOLUS
1000.0000 mL | Freq: Once | INTRAVENOUS | Status: AC
  Administered 2021-05-02: 1000 mL via INTRAVENOUS

## 2021-05-02 MED ORDER — ASPIRIN 81 MG PO CHEW
81.0000 mg | CHEWABLE_TABLET | Freq: Every day | ORAL | Status: DC
  Administered 2021-05-03 – 2021-05-05 (×3): 81 mg via ORAL
  Filled 2021-05-02 (×3): qty 1

## 2021-05-02 MED ORDER — LORATADINE 10 MG PO TABS: 10.0000 mg | ORAL_TABLET | Freq: Every day | ORAL | Status: DC | PRN

## 2021-05-02 MED ORDER — HYDRALAZINE HCL 20 MG/ML IJ SOLN: 5.0000 mg | INTRAMUSCULAR | Status: DC | PRN

## 2021-05-02 MED ORDER — ADULT MULTIVITAMIN W/MINERALS CH
ORAL_TABLET | Freq: Every day | ORAL | Status: DC
  Administered 2021-05-03 – 2021-05-05 (×3): 1 via ORAL
  Filled 2021-05-02 (×3): qty 1

## 2021-05-02 MED ORDER — VITAMIN D3 25 MCG (1000 UNIT) PO TABS
2000.0000 [IU] | ORAL_TABLET | Freq: Every day | ORAL | Status: DC
  Administered 2021-05-03 – 2021-05-05 (×3): 2000 [IU] via ORAL
  Filled 2021-05-02 (×6): qty 2

## 2021-05-02 MED ORDER — METHOTREXATE 2.5 MG PO TABS
15.0000 mg | ORAL_TABLET | ORAL | Status: DC
  Filled 2021-05-02: qty 6

## 2021-05-02 NOTE — Progress Notes (Addendum)
Sputum collected and sent at this time. Pt provided IS and acapella with education at this time, able to provide accurate teachback. Suction set up for patient to cough sputum. Educated regarding prone position and lung function.   O2 noted to be 89% upon return from bathroom without nasal cannula in place. Returned to 93% after replacement of 3L [place]. Continues to have significant rhonci, no distress and no c/o DOE or difficulty breathing at rest.

## 2021-05-02 NOTE — ED Triage Notes (Addendum)
Pt to ED for body aches, sore throat and cough from testing positive for covid, sx started Saturday  Seen at [facility] yesterday and px meds.  Pt in NAD Cough noted in triage RR even and unlabored  Pt has baseline tremor

## 2021-05-02 NOTE — Progress Notes (Signed)
Remdesivir - Pharmacy Brief Note   O:  ALT: 21 CXR: Patchy bilateral airspace disease most compatible with pneumonia. SpO2: 92% on 3 L [place]   A/P:  Remdesivir 200 mg IVPB once followed by 100 mg IVPB daily x 4 days.   Laureen Ochs, PharmD, BCPS 05/02/2021 4:07 PM

## 2021-05-02 NOTE — ED Provider Notes (Addendum)
[facility] Emergency Department Provider Note  ____________________________________________   Event Date/Time   First MD Initiated Contact with Patient 05/02/21 1256     (approximate)  I have reviewed the triage vital signs and the nursing notes.   HISTORY  Chief Complaint Covid Positive   HPI Sean Bailey is a 65 y.o. male past medical history of psoriasis on methotrexate, HTN, HDL, anxiety and mild tremor on propranolol who presents for assessment of approximately 4 days of fevers, chills, cough, intermittent chest tightness, shortness of breath, diarrhea, decreased appetite and fatigue.  Patient states he was seen at clinical clinic 3 days ago and diagnosed with COVID.  He was started on prednisone and azithromycin.  States he is coming to emergency room because he has not been feeling better.  Took some Tylenol yesterday but none today.  He denies any abdominal pain, vomiting, urinary symptoms, rash, acute extremity pain, earache or other clear associated sick symptoms.         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   Cervical radiculopathy, chronic 08/29/2016   Encounter for pre-employment examination 11/23/2015   Acute URI 08/29/2015   Influenza 08/29/2015   Thrombosed hemorrhoids 07/27/2015   Bleeding per rectum 07/26/2015   Rectal mass 07/26/2015   Anxiety 05/17/2015   Back ache 05/17/2015   Encounter for general adult medical examination without abnormal findings 05/17/2015    Hypercholesteremia 05/17/2015   Depression, major, single episode, moderate (HCC) 05/17/2015   Chronic cervical pain 05/17/2015   Psoriasis 05/17/2015   Avitaminosis D 05/17/2015   Chronic neck pain 05/16/2015   MDD (major depressive disorder), recurrent episode, moderate (HCC) 04/18/2015   Generalized anxiety disorder 04/18/2015    Past Surgical History:  Procedure Laterality Date   APPENDECTOMY  1966   COLONOSCOPY  2005, 2010   Dr Ricki Rodriguez   COLONOSCOPY WITH PROPOFOL N/A 07/22/2020   Procedure: COLONOSCOPY WITH PROPOFOL;  Surgeon: Regis Bill, MD;  Location: [facility] ENDOSCOPY;  Service: Endoscopy;  Laterality: N/A;    Prior to Admission medications   Medication Sig Start Date End Date Taking? Authorizing Provider  acetaminophen (TYLENOL) 650 MG CR tablet Take 1,300 mg by mouth daily as needed for pain.   Yes [provider]  aspirin 81 MG tablet Take 81 mg by mouth daily.   Yes [provider]  azithromycin (ZITHROMAX) 250 MG tablet Take 250 mg by mouth as directed. 05/01/21  Yes [provider]  benzonatate (TESSALON) 200 MG capsule Take 1 capsule by mouth 3 (three) times daily as needed. 05/01/21 05/08/21 Yes [provider]  calcipotriene (DOVONOX) 0.005 % cream Apply topically.   Yes [provider]  Cetirizine HCl 10 MG CAPS Take 10 mg by mouth daily as needed.    Yes [provider]  Cholecalciferol (VITAMIN D) 2000 UNITS tablet Take 2,000 Units by mouth daily.   Yes [provider]  etodolac (LODINE)  400 MG tablet Take 400 mg by mouth 2 (two) times daily. 04/17/21  Yes [provider]  folic acid (FOLVITE) 1 MG tablet Take 1 mg by mouth daily. 03/15/21  Yes [provider]  LACTASE PO Take by mouth.   Yes [provider]  methotrexate (RHEUMATREX) 2.5 MG tablet Take 15 mg by mouth every Wednesday. 04/19/21  Yes [provider]  MULTIPLE VITAMIN PO Take 1 tablet by mouth daily.   Yes  [provider]  propranolol (INDERAL) 80 MG tablet Take 80 mg by mouth 2 (two) times daily.   Yes [provider]  meloxicam (MOBIC) 15 MG tablet Take 1 tablet (15 mg total) by mouth daily. Patient not taking: Reported on 05/02/2021 08/29/16   Ellyn Hack, MD  tiZANidine (ZANAFLEX) 4 MG capsule Take 1 capsule (4 mg total) by mouth 3 (three) times daily as needed for muscle spasms. Patient not taking: Reported on 05/02/2021 08/29/16   Ellyn Hack, MD    Allergies Bee venom and Latex  Family History  Problem Relation Age of Onset   Depression Brother    Anxiety disorder Brother    Depression Mother    Anxiety disorder Mother    Lung disease Mother    Stroke Mother    Alcohol abuse Father    Brain cancer Father    Prostate cancer Father     Social History Social History   Tobacco Use   Smoking status: Never   Smokeless tobacco: Former    Types: Snuff, Chew    Quit date: 03/07/2014  Vaping Use   Vaping Use: Never used  Substance Use Topics   Alcohol use: Yes    Alcohol/week: 4.0 standard drinks    Types: 3 Glasses of wine, 1 Cans of beer per week    Comment: 1/week   Drug use: No    Review of Systems  Review of Systems  Constitutional:  Positive for chills, fever and malaise/fatigue.  HENT:  Negative for sore throat.   Eyes:  Negative for pain.  Respiratory:  Positive for cough and shortness of breath. Negative for stridor.   Cardiovascular:  Positive for chest pain.  Gastrointestinal:  Negative for vomiting.  Genitourinary:  Negative for dysuria.  Musculoskeletal:  Positive for myalgias.  Skin:  Negative for rash.  Neurological:  Negative for seizures, loss of consciousness and headaches.  Psychiatric/Behavioral:  Negative for suicidal ideas.   All other systems reviewed and are negative.    ____________________________________________   PHYSICAL EXAM:  VITAL SIGNS: ED Triage Vitals  Enc Vitals Group     BP 05/02/21 1226 132/73      Pulse Rate 05/02/21 1226 84     Resp 05/02/21 1226 20     Temp 05/02/21 1226 99.8 F (37.7 C)     Temp Source 05/02/21 1226 Oral     SpO2 05/02/21 1226 94 %     Weight 05/02/21 1227 195 lb (88.5 kg)     Height 05/02/21 1227 5\' 8"  (1.727 m)     Head Circumference --      Peak Flow --      Pain Score 05/02/21 1225 7     Pain Loc --      Pain Edu? --      Excl. in GC? --    Vitals:   05/02/21 1530 05/02/21 1630  BP: 140/70 132/66  Pulse: 81 77  Resp: (!) 22 (!) 30  Temp:  SpO2:     Physical Exam Vitals and nursing note reviewed.  Constitutional:      Appearance: He is well-developed. He is ill-appearing.  HENT:     Head: Normocephalic and atraumatic.     Right Ear: External ear normal.     Left Ear: External ear normal.     Nose: Nose normal.  Eyes:     Conjunctiva/sclera: Conjunctivae normal.  Cardiovascular:     Rate and Rhythm: Normal rate and regular rhythm.     Heart sounds: No murmur heard. Pulmonary:     Effort: Pulmonary effort is normal. No respiratory distress.     Breath sounds: Examination of the right-upper field reveals rhonchi. Examination of the left-upper field reveals rhonchi. Examination of the right-middle field reveals rhonchi. Examination of the left-middle field reveals rhonchi. Examination of the right-lower field reveals rhonchi. Examination of the left-lower field reveals rhonchi. Decreased breath sounds and rhonchi present.  Abdominal:     Palpations: Abdomen is soft.     Tenderness: There is no abdominal tenderness.  Musculoskeletal:     Cervical back: Neck supple.  Skin:    General: Skin is warm and dry.  Neurological:     Mental Status: He is alert and oriented to person, place, and time.  Psychiatric:        Mood and Affect: Mood normal.     ____________________________________________   LABS (all labs ordered are listed, but only abnormal results are displayed)  Labs Reviewed  CBC WITH DIFFERENTIAL/PLATELET - Abnormal;  Notable for the following components:      Result Value   WBC 13.7 (*)    RBC 4.11 (*)    HCT 38.7 (*)    Neutro Abs 11.8 (*)    All other components within normal limits  COMPREHENSIVE METABOLIC PANEL - Abnormal; Notable for the following components:   Glucose, Bld 130 (*)    Calcium 8.6 (*)    Albumin 3.3 (*)    All other components within normal limits  MAGNESIUM - Abnormal; Notable for the following components:   Magnesium 1.6 (*)    All other components within normal limits  TROPONIN I (HIGH SENSITIVITY) - Abnormal; Notable for the following components:   Troponin I (High Sensitivity) 54 (*)    All other components within normal limits  CULTURE, BLOOD (SINGLE)  PROCALCITONIN  LACTIC ACID, PLASMA  LACTIC ACID, PLASMA  HEMOGLOBIN A1C  LIPID PANEL  TROPONIN I (HIGH SENSITIVITY)   ____________________________________________  EKG  Sinus rhythm with a ventricular rate of 88, normal axis, some artifact in lead III, V1 and V3 without clear evidence of acute ischemia or significant arrhythmia. ____________________________________________  RADIOLOGY  ED MD interpretation: Chest x-ray has bilateral opacities most consistent with multifocal pneumonia.  No clear overt edema, large effusion, pneumothorax or other clear acute intrathoracic process.  CTA chest shows bilateral patchy opacities consistent with multifocal pneumonia.  No evidence of PE or other clear acute intrathoracic process.  Official radiology report(s): CT Angio Chest PE W and/or Wo Contrast  Result Date: 05/02/2021 CLINICAL DATA:  COVID.  Sore throat, cough EXAM: CT ANGIOGRAPHY CHEST WITH CONTRAST TECHNIQUE: Multidetector CT imaging of the chest was performed using the standard protocol during bolus administration of intravenous contrast. Multiplanar CT image reconstructions and MIPs were obtained to evaluate the vascular anatomy. CONTRAST:  53mL OMNIPAQUE IOHEXOL 350 MG/ML SOLN COMPARISON:  None. FINDINGS:  Cardiovascular: No filling defects in the pulmonary arteries to suggest pulmonary emboli. Heart is normal size. Aorta  is normal caliber. Mediastinum/Nodes: No mediastinal, hilar, or axillary adenopathy. Trachea and esophagus are unremarkable. Thyroid unremarkable. Lungs/Pleura: Patchy bilateral airspace disease, left greater than right most compatible with multifocal pneumonia. No effusions. Upper Abdomen: Imaging into the upper abdomen demonstrates no acute findings. Musculoskeletal: Chest wall soft tissues are unremarkable. No acute bony abnormality. Review of the MIP images confirms the above findings. IMPRESSION: Patchy bilateral airspace disease, left greater than right compatible with multifocal pneumonia. No evidence of pulmonary embolus. Electronically Signed   By: Charlett NoseKevin  Dover M.D.   On: 05/02/2021 16:19   DG Chest Portable 1 View  Result Date: 05/02/2021 CLINICAL DATA:  Body aches, fever, sore throat and cough. The patient tested positive for COVID-19. EXAM: PORTABLE CHEST 1 VIEW COMPARISON:  PA and lateral chest 07/15/2012. FINDINGS: There is patchy bilateral airspace disease. Heart size is normal. No pneumothorax or pleural effusion. IMPRESSION: Patchy bilateral airspace disease most compatible with pneumonia. Electronically Signed   By: Drusilla Kannerhomas  Dalessio M.D.   On: 05/02/2021 14:09    ____________________________________________   PROCEDURES  Procedure(s) performed (including Critical Care):  .Critical Care  Date/Time: 05/02/2021 4:03 PM Performed by: Gilles ChiquitoSmith, Sierra Bissonette P, MD Authorized by: Gilles ChiquitoSmith, Sloka Volante P, MD   Critical care provider statement:    Critical care time (minutes):  45   Critical care was necessary to treat or prevent imminent or life-threatening deterioration of the following conditions:  Respiratory failure and sepsis   Critical care was time spent personally by me on the following activities:  Discussions with consultants, evaluation of patient's response to treatment,  examination of patient, ordering and performing treatments and interventions, ordering and review of laboratory studies, ordering and review of radiographic studies, pulse oximetry, re-evaluation of patient's condition, obtaining history from patient or surrogate and review of old charts   ____________________________________________   INITIAL IMPRESSION / ASSESSMENT AND PLAN / ED COURSE      Presents with above to history exam for assessment worsening cough shortness of breath myalgias fever and diarrhea for last couple days after recent diagnosis of COVID-19.  On arrival patient is afebrile hemodynamically stable on several reassessments his SPO2 is borderline 90% and 1.2 to 88%.  He was placed on 2 L nasal cannula improved back to the mid 90s.  He is otherwise mildly tachypneic but with otherwise stable vital signs.  Differential includes COVID-pneumonia, superinfection with bacterial pneumonia, PE, myocarditis, arrhythmia, anemia, metabolic derangements  Chest x-ray has findings consistent with multifocal pneumonia.  Patient does not appear volume overloaded and overall have a low suspicion for acute heart failure.  CBC has a leukocytosis with WBC count of 13.7 some unexpected especially given neutrophil predominance with 11.8 thousand absolute neutrophils.  Given tachypnea and respiratory failure we will treat for possible community-acquired pneumonia as well with Rocephin and is a throat.  We will also obtain a blood culture as patient meet sepsis criteria with leukopenia and tachypnea.  We will also gently hydrate with some IV fluids as patient appears close to euvolemia at this time.  CMP shows no significant electrolyte or metabolic derangements.  Magnesium is low at 1.6.  Troponin is elevated at 54.  Patient denies any chest pain on several reassessments and I suspect this is likely related to some demand ischemia in the setting of COVID and hypoxia.  We will give a dose of ASA but defer  heparin at this time pending repeat.  Will obtain CTA chest to rule out PE.     CTA chest shows bilateral  patchy opacities consistent with multifocal pneumonia.  No evidence of PE or other clear acute intrathoracic process.  Decadron and remdesivir ordered.  I will plan to admit to medicine service for further evaluation and management.        ____________________________________________   FINAL CLINICAL IMPRESSION(S) / ED DIAGNOSES  Final diagnoses:  COVID  Acute respiratory failure with hypoxia (HCC)  Troponin I above reference range  Hypomagnesemia    Medications  magnesium sulfate IVPB 4 g 100 mL (4 g Intravenous New Bag/Given 05/02/21 1648)  azithromycin (ZITHROMAX) 500 mg in sodium chloride 0.9 % 250 mL IVPB (500 mg Intravenous New Bag/Given 05/02/21 1644)  remdesivir 200 mg in sodium chloride 0.9% 250 mL IVPB (has no administration in time range)    Followed by  remdesivir 100 mg in sodium chloride 0.9 % 100 mL IVPB (has no administration in time range)  acetaminophen (TYLENOL) 160 MG/5ML solution 650 mg (has no administration in time range)  hydrALAZINE (APRESOLINE) injection 5 mg (has no administration in time range)  ondansetron (ZOFRAN) injection 4 mg (has no administration in time range)  ipratropium (ATROVENT HFA) inhaler 2 puff (has no administration in time range)  albuterol (VENTOLIN HFA) 108 (90 Base) MCG/ACT inhaler 2 puff (has no administration in time range)  dextromethorphan-guaiFENesin (MUCINEX DM) 30-600 MG per 12 hr tablet 1 tablet (has no administration in time range)  cefTRIAXone (ROCEPHIN) 1 g in sodium chloride 0.9 % 100 mL IVPB (has no administration in time range)  azithromycin (ZITHROMAX) 500 mg in sodium chloride 0.9 % 250 mL IVPB (has no administration in time range)  methylPREDNISolone sodium succinate (SOLU-MEDROL) 40 mg/mL injection 40 mg (has no administration in time range)  acetaminophen (TYLENOL) tablet 1,000 mg (1,000 mg Oral Given 05/02/21  1500)  aspirin chewable tablet 324 mg (324 mg Oral Given 05/02/21 1650)  lactated ringers bolus 1,000 mL (1,000 mLs Intravenous New Bag/Given 05/02/21 1639)  cefTRIAXone (ROCEPHIN) 1 g in sodium chloride 0.9 % 100 mL IVPB (0 g Intravenous Stopped 05/02/21 1650)  iohexol (OMNIPAQUE) 350 MG/ML injection 75 mL (75 mLs Intravenous Contrast Given 05/02/21 1602)     ED Discharge Orders     None        Note:  This document was prepared using Dragon voice recognition software and may include unintentional dictation errors.    Gilles Chiquito, MD 05/02/21 1603    Gilles Chiquito, MD 05/02/21 1633    Gilles Chiquito, MD 05/02/21 [phone]

## 2021-05-02 NOTE — Progress Notes (Signed)
Notified bedside nurse of need to draw lactic acid and blood cultures.  

## 2021-05-02 NOTE — Progress Notes (Signed)
Antibiotics hung before blood cultures drawn per ED RN

## 2021-05-02 NOTE — ED Notes (Signed)
Informed RN bed assigned 

## 2021-05-02 NOTE — ED Notes (Signed)
Oxygen sat 88% on room air, placed oxygen on pt via nasal cannula at 3L, oxygen sat 92%

## 2021-05-02 NOTE — Progress Notes (Signed)
Elink following for code sepsis 

## 2021-05-02 NOTE — H&P (Signed)
History and Physical    Sean Bailey BOF:[id number] DOB: 1956/08/20 DOA: 05/02/2021  Referring MD/NP/PA:   PCP: Danella Penton, MD   Patient coming from:  The patient is coming from home.  At baseline, pt is independent for most of ADL.        Chief Complaint: Shortness breath, cough, sore throat, fever, chills  HPI: Sean Bailey is a 65 y.o. male with medical history significant of hypertension, hyperlipidemia, depression, anxiety, tremor, chronic back pain and neck pain, kidney stone, psoriasis methotrexate, who presents with shortness breath, cough, sore throat, fever and chills.  Patient states that he has been sick for more than 4 days, symptoms include cough, shortness of breath, sore throat, body aches, fever and chills.  Patient coughs up white mucus.  He has gurgling sound in his throat. Patient has poor appetite, decreased oral intake.  He denies chest pain, no nausea, vomiting, diarrhea or abdominal pain.  Patient had positive COVID test in clinic 05/01/21, and was started on azithromycin and prednisone.  Patient stated he has been taking these medications, but no any improvement.  Actually his symptoms have been progressively worsening.  Patient does not have symptoms of UTI.  He states that he had COVID-19 test, but did not get the booster.  ED Course: pt was found to have WBC 13.7, troponin level 54, lactic acid of 1.4, GFR> 60, temperature 100.2, blood pressure 144/71, heart rate 87, RR 22, oxygen saturation 88% on room air, which improved to 94% on 3 L oxygen.  Chest x-ray showed bilateral patchy infiltration.  CT angiogram is negative for PE, showed bilateral multifocal infiltration.  Patient is admitted to MedSurg bed as inpatient.  Review of Systems:   General: has fevers, chills, no body weight gain, has poor appetite, has fatigue HEENT: no blurry vision, hearing changes or sore throat Respiratory: has dyspnea, coughing, no wheezing CV: no chest pain, no  palpitations GI: no nausea, vomiting, abdominal pain, diarrhea, constipation GU: no dysuria, burning on urination, increased urinary frequency, hematuria  Ext: no leg edema Neuro: no unilateral weakness, numbness, or tingling, no vision change or hearing loss. Has hand tremor Skin: no rash, no skin tear. MSK: No muscle spasm, no deformity, no limitation of range of movement in spin Heme: No easy bruising.  Travel history: No recent long distant travel.  Allergy:  Allergies  Allergen Reactions   Bee Venom Anaphylaxis    Bee stings- was stung twice about 50 years and had trouble breathing   Latex     Past Medical History:  Diagnosis Date   Anxiety    Hemorrhoid    Hyperlipidemia    Hypertension    Tremors of nervous system     Past Surgical History:  Procedure Laterality Date   APPENDECTOMY  1966   COLONOSCOPY  2005, 2010   Dr Ricki Rodriguez   COLONOSCOPY WITH PROPOFOL N/A 07/22/2020   Procedure: COLONOSCOPY WITH PROPOFOL;  Surgeon: Regis Bill, MD;  Location: [facility] ENDOSCOPY;  Service: Endoscopy;  Laterality: N/A;    Social History:  reports that he has never smoked. He quit smokeless tobacco use about 7 years ago.  His smokeless tobacco use included snuff and chew. He reports current alcohol use of about 4.0 standard drinks of alcohol per week. He reports that he does not use drugs.  Family History:  Family History  Problem Relation Age of Onset   Depression Brother    Anxiety disorder Brother    Depression Mother  Anxiety disorder Mother    Lung disease Mother    Stroke Mother    Alcohol abuse Father    Brain cancer Father    Prostate cancer Father      Prior to Admission medications   Medication Sig Start Date End Date Taking? Authorizing Provider  acetaminophen (TYLENOL 8 HOUR ARTHRITIS PAIN) 650 MG CR tablet Take 1,300 mg by mouth daily as needed for pain.    [provider]  aspirin 81 MG tablet Take by mouth.    [provider]   calcipotriene (DOVONOX) 0.005 % cream Apply topically.    [provider]  Cetirizine HCl 10 MG CAPS Take 10 mg by mouth daily as needed.     [provider]  Cholecalciferol (VITAMIN D) 2000 UNITS tablet Take by mouth.    [provider]  LACTASE PO Take by mouth.    [provider]  meloxicam (MOBIC) 15 MG tablet Take 1 tablet (15 mg total) by mouth daily. 08/29/16   Ellyn Hack, MD  MULTIPLE VITAMIN PO Take by mouth.    [provider]  propranolol (INDERAL) 80 MG tablet Take 80 mg by mouth 2 (two) times daily.    [provider]  tiZANidine (ZANAFLEX) 4 MG capsule Take 1 capsule (4 mg total) by mouth 3 (three) times daily as needed for muscle spasms. 08/29/16   Ellyn Hack, MD    Physical Exam: Vitals:   05/02/21 1630 05/02/21 1700 05/02/21 1730 05/02/21 1800  BP: 132/66 134/67 131/72 128/69  Pulse: 77 71 72 72  Resp: (!) 30 (!) 25 (!) 26 (!) 24  Temp:      TempSrc:      SpO2:    98%  Weight:      Height:       General: Not in acute distress HEENT:       Eyes: PERRL, EOMI, no scleral icterus.       ENT: No discharge from the ears and nose, no pharynx injection, no tonsillar enlargement.        Neck: No JVD, no bruit, no mass felt. Heme: No neck lymph node enlargement. Cardiac: S1/S2, RRR, No murmurs, No gallops or rubs. Respiratory: Has crackles bilaterally, has gurgling sound in the upper chest GI: Soft, nondistended, nontender, no rebound pain, no organomegaly, BS present. GU: No hematuria Ext: No pitting leg edema bilaterally. 1+DP/PT pulse bilaterally. Musculoskeletal: No joint deformities, No joint redness or warmth, no limitation of ROM in spin. Skin: No rashes.  Neuro: Alert, oriented X3, cranial nerves II-XII grossly intact, moves all extremities normally. Psych: Patient is not psychotic, no suicidal or hemocidal ideation.  Labs on Admission: I have personally reviewed following labs and imaging  studies  CBC: Recent Labs  Lab 05/02/21 1450  WBC 13.7*  NEUTROABS 11.8*  HGB 13.6  HCT 38.7*  MCV 94.2  PLT 179   Basic Metabolic Panel: Recent Labs  Lab 05/02/21 1450  NA 136  K 3.6  CL 107  CO2 23  GLUCOSE 130*  BUN 15  CREATININE 1.23  CALCIUM 8.6*  MG 1.6*   GFR: Estimated Creatinine Clearance: 66.4 mL/min (by C-G formula based on SCr of 1.23 mg/dL). Liver Function Tests: Recent Labs  Lab 05/02/21 1450  AST 28  ALT 21  ALKPHOS 49  BILITOT 1.1  PROT 6.7  ALBUMIN 3.3*   No results for input(s): LIPASE, AMYLASE in the last 168 hours. No results for input(s): AMMONIA in  the last 168 hours. Coagulation Profile: No results for input(s): INR, PROTIME in the last 168 hours. Cardiac Enzymes: No results for input(s): CKTOTAL, CKMB, CKMBINDEX, TROPONINI in the last 168 hours. BNP (last 3 results) No results for input(s): PROBNP in the last 8760 hours. HbA1C: No results for input(s): HGBA1C in the last 72 hours. CBG: No results for input(s): GLUCAP in the last 168 hours. Lipid Profile: No results for input(s): CHOL, HDL, LDLCALC, TRIG, CHOLHDL, LDLDIRECT in the last 72 hours. Thyroid Function Tests: No results for input(s): TSH, T4TOTAL, FREET4, T3FREE, THYROIDAB in the last 72 hours. Anemia Panel: No results for input(s): VITAMINB12, FOLATE, FERRITIN, TIBC, IRON, RETICCTPCT in the last 72 hours. Urine analysis: No results found for: COLORURINE, APPEARANCEUR, LABSPEC, PHURINE, GLUCOSEU, HGBUR, BILIRUBINUR, KETONESUR, PROTEINUR, UROBILINOGEN, NITRITE, LEUKOCYTESUR Sepsis Labs: @LABRCNTIP (procalcitonin:4,lacticidven:4) )No results found for this or any previous visit (from the past 240 hour(s)).   Radiological Exams on Admission: CT Angio Chest PE W and/or Wo Contrast  Result Date: 05/02/2021 CLINICAL DATA:  COVID.  Sore throat, cough EXAM: CT ANGIOGRAPHY CHEST WITH CONTRAST TECHNIQUE: Multidetector CT imaging of the chest was performed using the standard  protocol during bolus administration of intravenous contrast. Multiplanar CT image reconstructions and MIPs were obtained to evaluate the vascular anatomy. CONTRAST:  75mL OMNIPAQUE IOHEXOL 350 MG/ML SOLN COMPARISON:  None. FINDINGS: Cardiovascular: No filling defects in the pulmonary arteries to suggest pulmonary emboli. Heart is normal size. Aorta is normal caliber. Mediastinum/Nodes: No mediastinal, hilar, or axillary adenopathy. Trachea and esophagus are unremarkable. Thyroid unremarkable. Lungs/Pleura: Patchy bilateral airspace disease, left greater than right most compatible with multifocal pneumonia. No effusions. Upper Abdomen: Imaging into the upper abdomen demonstrates no acute findings. Musculoskeletal: Chest wall soft tissues are unremarkable. No acute bony abnormality. Review of the MIP images confirms the above findings. IMPRESSION: Patchy bilateral airspace disease, left greater than right compatible with multifocal pneumonia. No evidence of pulmonary embolus. Electronically Signed   By: Charlett NoseKevin  Dover M.D.   On: 05/02/2021 16:19   DG Chest Portable 1 View  Result Date: 05/02/2021 CLINICAL DATA:  Body aches, fever, sore throat and cough. The patient tested positive for COVID-19. EXAM: PORTABLE CHEST 1 VIEW COMPARISON:  PA and lateral chest 07/15/2012. FINDINGS: There is patchy bilateral airspace disease. Heart size is normal. No pneumothorax or pleural effusion. IMPRESSION: Patchy bilateral airspace disease most compatible with pneumonia. Electronically Signed   By: Drusilla Kannerhomas  Dalessio M.D.   On: 05/02/2021 14:09     EKG: I have personally reviewed.  Not done in ED, will get one.   Assessment/Plan Principal Problem:   Pneumonia due to COVID-19 virus Active Problems:   Psoriasis   Tremors of nervous system   HTN (hypertension)   HLD (hyperlipidemia)   Acute respiratory failure with hypoxia (HCC)   Sepsis (HCC)   Elevated troponin   Hypomagnesemia   Sepsis and acute respiratory failure  with hypoxia due to pneumonia due to COVID-19 virus: Patient has oxygen desaturation to 88% on room air, currently on 3 L oxygen.  Patient meets criteria for sepsis with leukocytosis with WBC 13.7 and tachypnea with RR 22.  Patient also has a fever 100.2.  Lactic acid is normal. 1.4. Currently hemodynamically stable.  Patient has leukocytosis, cannot completely rule out superimposed bacterial pneumonia.  Will start IV antibiotics.  -will admit to med-surg bed as inpt -Remdesivir per pharm -Rocephin and azithromycin -Solumedrol 40 mg bid (patient received 6 mg of Decadron in ED) -Bronchodilators -PRN Mucinex for cough -f/u  Blood culture -Gentle IV fluid: 1L of LR was given in ED -Follow-up inflammatory markers -Will ask the patient to maintain an awake prone position for 16+ hours a day, if possible, with a minimum of 2-3 hours at a time -Will attempt to maintain euvolemia to a net negative fluid status -IF patient deteriorates, will consult PCCM and ID - deep suction -will get Procalcitonin  --> 4.53  Psoriasis -on weekly methotrexate  Tremors of nervous system -Propranolol  HTN (hypertension) -Propranolol -IV hydralazine as needed  HLD (hyperlipidemia): Not taking medications currently -Follow-up with PCP  Elevated troponin: Troponin level 54, denies chest pain.  Likely due to demand ischemia -Aspirin 81 mg daily -Trend troponin -Check A1c, FLP  Hypomagnesemia: Magnesium 1.6 -4 g of magnesium sulfate IV    DVT ppx: SQ Lovenox Code Status: Full code Family Communication: I have tried to call his wife, left a message for her Disposition Plan:  Anticipate discharge back to previous environment Consults called:  none Admission status and Level of care: Med-Surg:     as inpt        Status is: Inpatient  Remains inpatient appropriate because:Inpatient level of care appropriate due to severity of illness  Dispo: The patient is from: Home              Anticipated d/c is  to: Home              Patient currently is not medically stable to d/c.   Difficult to place patient No          Date of Service 05/02/2021    Lorretta Harp [facility]   If 7PM-7AM, please contact night-coverage [email or web address] 05/02/2021, 6:20 PM

## 2021-05-03 LAB — CBC WITH DIFFERENTIAL/PLATELET
Abs Immature Granulocytes: 0.04 10*3/uL (ref 0.00–0.07)
Basophils Absolute: 0 10*3/uL (ref 0.0–0.1)
Basophils Relative: 0 %
Eosinophils Absolute: 0 10*3/uL (ref 0.0–0.5)
Eosinophils Relative: 0 %
HCT: 39.1 % (ref 39.0–52.0)
Hemoglobin: 13.3 g/dL (ref 13.0–17.0)
Immature Granulocytes: 0 %
Lymphocytes Relative: 10 %
Lymphs Abs: 1.1 10*3/uL (ref 0.7–4.0)
MCH: 31.8 pg (ref 26.0–34.0)
MCHC: 34 g/dL (ref 30.0–36.0)
MCV: 93.5 fL (ref 80.0–100.0)
Monocytes Absolute: 0.7 10*3/uL (ref 0.1–1.0)
Monocytes Relative: 6 %
Neutro Abs: 9.6 10*3/uL — ABNORMAL HIGH (ref 1.7–7.7)
Neutrophils Relative %: 84 %
Platelets: 191 10*3/uL (ref 150–400)
RBC: 4.18 MIL/uL — ABNORMAL LOW (ref 4.22–5.81)
RDW: 14.7 % (ref 11.5–15.5)
Smear Review: NORMAL
WBC: 11.5 10*3/uL — ABNORMAL HIGH (ref 4.0–10.5)
nRBC: 0 % (ref 0.0–0.2)

## 2021-05-03 LAB — LIPID PANEL
Cholesterol: 121 mg/dL (ref 0–200)
HDL: 37 mg/dL — ABNORMAL LOW (ref >40)
LDL Cholesterol: 69 mg/dL (ref 0–99)
Total CHOL/HDL Ratio: 3.3 RATIO
Triglycerides: 73 mg/dL (ref <150)
VLDL: 15 mg/dL (ref 0–40)

## 2021-05-03 LAB — COMPREHENSIVE METABOLIC PANEL
ALT: 18 U/L (ref 0–44)
AST: 27 U/L (ref 15–41)
Albumin: 2.8 g/dL — ABNORMAL LOW (ref 3.5–5.0)
Alkaline Phosphatase: 47 U/L (ref 38–126)
Anion gap: 5 (ref 5–15)
BUN: 16 mg/dL (ref 8–23)
CO2: 26 mmol/L (ref 22–32)
Calcium: 8.3 mg/dL — ABNORMAL LOW (ref 8.9–10.3)
Chloride: 106 mmol/L (ref 98–111)
Creatinine, Ser: 1.12 mg/dL (ref 0.61–1.24)
GFR, Estimated: >60 mL/min (ref >60)
Glucose, Bld: 193 mg/dL — ABNORMAL HIGH (ref 70–99)
Potassium: 4.6 mmol/L (ref 3.5–5.1)
Sodium: 137 mmol/L (ref 135–145)
Total Bilirubin: 0.6 mg/dL (ref 0.3–1.2)
Total Protein: 6.6 g/dL (ref 6.5–8.1)

## 2021-05-03 LAB — C-REACTIVE PROTEIN: CRP: 32.6 mg/dL — ABNORMAL HIGH (ref <1.0)

## 2021-05-03 LAB — D-DIMER, QUANTITATIVE: D-Dimer, Quant: 1.11 ug/mL-FEU — ABNORMAL HIGH (ref 0.00–0.50)

## 2021-05-03 LAB — HEMOGLOBIN A1C
Hgb A1c MFr Bld: 5.5 % (ref 4.8–5.6)
Mean Plasma Glucose: 111.15 mg/dL

## 2021-05-03 LAB — HIV ANTIBODY (ROUTINE TESTING W REFLEX): HIV Screen 4th Generation wRfx: NONREACTIVE

## 2021-05-03 LAB — FERRITIN: Ferritin: 605 ng/mL — ABNORMAL HIGH (ref 24–336)

## 2021-05-03 MED ORDER — FLUTICASONE PROPIONATE 50 MCG/ACT NA SUSP
1.0000 | Freq: Every day | NASAL | Status: DC
  Administered 2021-05-03 – 2021-05-05 (×3): 1 via NASAL
  Filled 2021-05-03: qty 16

## 2021-05-03 MED ORDER — GUAIFENESIN ER 600 MG PO TB12
1200.0000 mg | ORAL_TABLET | Freq: Two times a day (BID) | ORAL | Status: DC
  Administered 2021-05-03 – 2021-05-05 (×4): 1200 mg via ORAL
  Filled 2021-05-03 (×4): qty 2

## 2021-05-03 MED ORDER — METHYLPREDNISOLONE SODIUM SUCC 125 MG IJ SOLR
50.0000 mg | Freq: Two times a day (BID) | INTRAMUSCULAR | Status: DC
  Administered 2021-05-03 – 2021-05-05 (×4): 50 mg via INTRAVENOUS
  Filled 2021-05-03 (×4): qty 2

## 2021-05-03 MED ORDER — ALPRAZOLAM 0.25 MG PO TABS: 0.2500 mg | ORAL_TABLET | Freq: Three times a day (TID) | ORAL | Status: DC | PRN

## 2021-05-03 MED ORDER — ALBUTEROL SULFATE HFA 108 (90 BASE) MCG/ACT IN AERS
2.0000 | INHALATION_SPRAY | Freq: Four times a day (QID) | RESPIRATORY_TRACT | Status: DC
  Administered 2021-05-03 – 2021-05-05 (×8): 2 via RESPIRATORY_TRACT
  Filled 2021-05-03: qty 6.7

## 2021-05-03 NOTE — TOC Progression Note (Signed)
Transition of Care [facility]) - Progression Note    Patient Details  Name: Sean Bailey MRN: [id number] Date of Birth: Jun 03, 1956  Transition [facility]) CM/SW Contact  Caryn Section, RN Phone Number: 05/03/2021, 10:55 AM  Clinical Narrative:  RNCM spoke to patient on the phone due to COVID + status and isolation.   Patient lives at home with wife and grandson.  Typically wife would be able to assist patient at home, but she has recently tested positive for COVID as well.  She is currently at home but can seek assistance if required.  Patient feels comfortable returning home at discharge, but remains concerned about wife's condition.  Patient has reliable transportation to appointments and is able to obtain medications.  He reports that he takes medications as directed without concerns.  Patient does not currently have home health, and does not anticipate needing these services.     TOC contact information provided, patient encouraged to contact RNCM if needs arise.       Expected Discharge Plan and Services                                                 Social Determinants of Health (SDOH) Interventions    Readmission Risk Interventions No flowsheet data found.

## 2021-05-03 NOTE — Progress Notes (Signed)
PROGRESS NOTE        PATIENT DETAILS Name: Sean Bailey Age: 65 y.o. Sex: male Date of Birth: 04/04/1956 Admit Date: 05/02/2021 Admitting Physician Lorretta HarpXilin Niu, MD ZOX:[id number]:Miller, Hardin NegusMark F, MD  Brief Narrative: Patient is a 65 y.o. male with history of HTN, HLD, psoriasis on methotrexate who presented with cough/shortness of breath-was found to have acute hypoxic respiratory failure due to COVID-19 pneumonia +/- superimposed bacterial pneumonia.  Significant events: 8/2>> admit with SOB/cough-found to have COVID-19 pneumonia  Significant studies: 8/2>> CT angio chest: Patchy bilateral airspace disease.  No PE.  COVID-19 therapy:  Remdesivir: 8/2>> Steroids: 8/2>>  Antimicrobial therapy: Rocephin: 8/2>> Zithromax: 8/2>>  Microbiology data: 8/2>> blood culture: Negative 8/2>> sputum culture: Pending  Procedures : None  Consults: None  DVT Prophylaxis : enoxaparin (LOVENOX) injection 40 mg Start: 05/02/21 2200   Subjective: Unchanged compared to yesterday-continues to cough   Assessment/Plan: Acute hypoxic respiratory failure due to COVID-19 pneumonia +/- bacterial pneumonia: Stable on just 2-3 L of oxygen-continues to cough-plan is to encourage mobilization/incentive spirometry/flutter valve and to continue Remdesivir/steroids and antibiotics.  Psoriasis: Hold methotrexate for now-until pneumonia/COVID-19 infection improves.  HTN: BP stable-continue propranolol  Essential tremors: Stable-on propanolol  Minimally elevated troponin: Due to demand ischemia-doubt any clinical significance.  Diet: Diet Order             Diet Heart Room service appropriate? Yes; Fluid consistency: Thin  Diet effective now                    Code Status: Full code   Family Communication: None at bedside  Disposition Plan: Status is: Inpatient  Remains inpatient appropriate because:Inpatient level of care appropriate due to severity of  illness  Dispo: The patient is from: Home              Anticipated d/c is to: Home              Patient currently is not medically stable to d/c.   Difficult to place patient No    Barriers to Discharge: Hypoxia due to COVID-19 pneumonia/bacterial pneumonia-still on O2-requiring IV antimicrobial therapy  Antimicrobial agents: Anti-infectives (From admission, onward)    Start     Dose/Rate Route Frequency Ordered Stop   05/03/21 1600  cefTRIAXone (ROCEPHIN) 1 g in sodium chloride 0.9 % 100 mL IVPB        1 g 200 mL/hr over 30 Minutes Intravenous Every 24 hours 05/02/21 1628     05/03/21 1600  azithromycin (ZITHROMAX) 500 mg in sodium chloride 0.9 % 250 mL IVPB        500 mg 250 mL/hr over 60 Minutes Intravenous Every 24 hours 05/02/21 1628     05/03/21 1000  remdesivir 100 mg in sodium chloride 0.9 % 100 mL IVPB       See Hyperspace for full Linked Orders Report.   100 mg 200 mL/hr over 30 Minutes Intravenous Daily 05/02/21 1607 05/07/21 0959   05/02/21 1800  remdesivir 200 mg in sodium chloride 0.9% 250 mL IVPB       See Hyperspace for full Linked Orders Report.   200 mg 580 mL/hr over 30 Minutes Intravenous Once 05/02/21 1607 05/02/21 1921   05/02/21 1600  cefTRIAXone (ROCEPHIN) 1 g in sodium chloride 0.9 % 100 mL IVPB  1 g 200 mL/hr over 30 Minutes Intravenous  Once 05/02/21 1558 05/02/21 1650   05/02/21 1600  azithromycin (ZITHROMAX) 500 mg in sodium chloride 0.9 % 250 mL IVPB        500 mg 250 mL/hr over 60 Minutes Intravenous  Once 05/02/21 1558 05/02/21 1749        Time spent: 35 minutes-Greater than 50% of this time was spent in counseling, explanation of diagnosis, planning of further management, and coordination of care.  MEDICATIONS: Scheduled Meds:  albuterol  2 puff Inhalation Q6H   aspirin  81 mg Oral Daily   calcipotriene   Topical q morning   cholecalciferol  2,000 Units Oral Daily   dextromethorphan-guaiFENesin  1 tablet Oral BID   enoxaparin  (LOVENOX) injection  40 mg Subcutaneous Q24H   fluticasone  1 spray Each Nare Daily   folic acid  1 mg Oral Daily   ipratropium  2 puff Inhalation Q4H   methylPREDNISolone (SOLU-MEDROL) injection  50 mg Intravenous Q12H   multivitamin with minerals   Oral Daily   propranolol  80 mg Oral BID   Continuous Infusions:  azithromycin     cefTRIAXone (ROCEPHIN)  IV     remdesivir 100 mg in NS 100 mL 100 mg (05/03/21 1039)   PRN Meds:.acetaminophen (TYLENOL) oral liquid 160 mg/5 mL, hydrALAZINE, loratadine, ondansetron (ZOFRAN) IV   PHYSICAL EXAM: Vital signs: Vitals:   05/03/21 0526 05/03/21 0700 05/03/21 1130 05/03/21 1537  BP: (!) 168/87 (!) 149/76 134/74 (!) 145/78  Pulse: 66 68 70 73  Resp: 19 18 16 18   Temp: 98.6 F (37 C) 98.4 F (36.9 C) 98.2 F (36.8 C) 97.8 F (36.6 C)  TempSrc: Oral Oral    SpO2: 97% 97% 95% 97%  Weight:      Height:       Filed Weights   05/02/21 1227  Weight: 88.5 kg   Body mass index is 29.65 kg/m.   Gen Exam:Alert awake-not in any distress HEENT:atraumatic, normocephalic Chest: B/L clear to auscultation anteriorly CVS:S1S2 regular Abdomen:soft non tender, non distended Extremities:no edema Neurology: Non focal Skin: no rash  I have personally reviewed following labs and imaging studies  LABORATORY DATA: CBC: Recent Labs  Lab 05/02/21 1450 05/03/21 0423  WBC 13.7* 11.5*  NEUTROABS 11.8* 9.6*  HGB 13.6 13.3  HCT 38.7* 39.1  MCV 94.2 93.5  PLT 179 191    Basic Metabolic Panel: Recent Labs  Lab 05/02/21 1450 05/03/21 0423  NA 136 137  K 3.6 4.6  CL 107 106  CO2 23 26  GLUCOSE 130* 193*  BUN 15 16  CREATININE 1.23 1.12  CALCIUM 8.6* 8.3*  MG 1.6*  --     GFR: Estimated Creatinine Clearance: 73 mL/min (by C-G formula based on SCr of 1.12 mg/dL).  Liver Function Tests: Recent Labs  Lab 05/02/21 1450 05/03/21 0423  AST 28 27  ALT 21 18  ALKPHOS 49 47  BILITOT 1.1 0.6  PROT 6.7 6.6  ALBUMIN 3.3* 2.8*   No  results for input(s): LIPASE, AMYLASE in the last 168 hours. No results for input(s): AMMONIA in the last 168 hours.  Coagulation Profile: No results for input(s): INR, PROTIME in the last 168 hours.  Cardiac Enzymes: No results for input(s): CKTOTAL, CKMB, CKMBINDEX, TROPONINI in the last 168 hours.  BNP (last 3 results) No results for input(s): PROBNP in the last 8760 hours.  Lipid Profile: Recent Labs    05/03/21 0423  CHOL 121  HDL 37*  LDLCALC 69  TRIG 73  CHOLHDL 3.3    Thyroid Function Tests: No results for input(s): TSH, T4TOTAL, FREET4, T3FREE, THYROIDAB in the last 72 hours.  Anemia Panel: Recent Labs    05/02/21 1919  FERRITIN 605*    Urine analysis: No results found for: COLORURINE, APPEARANCEUR, LABSPEC, PHURINE, GLUCOSEU, HGBUR, BILIRUBINUR, KETONESUR, PROTEINUR, UROBILINOGEN, NITRITE, LEUKOCYTESUR  Sepsis Labs: Lactic Acid, Venous    Component Value Date/Time   LATICACIDVEN 1.5 05/02/2021 1919    MICROBIOLOGY: Recent Results (from the past 240 hour(s))  Blood culture (routine single)     Status: None (Preliminary result)   Collection Time: 05/02/21  5:10 PM   Specimen: BLOOD  Result Value Ref Range Status   Specimen Description BLOOD BLOOD LEFT FOREARM  Final   Special Requests   Final    BOTTLES DRAWN AEROBIC AND ANAEROBIC Blood Culture adequate volume   Culture   Final    NO GROWTH < 24 HOURS Performed at [facility], [address] [place], [place] [address]    Report Status PENDING  Incomplete  Expectorated Sputum Assessment w Gram Stain, Rflx to Resp Cult     Status: None   Collection Time: 05/02/21  7:40 PM   Specimen: Expectorated Sputum  Result Value Ref Range Status   Specimen Description EXPECTORATED SPUTUM  Final   Special Requests NONE  Final   Sputum evaluation   Final    THIS SPECIMEN IS ACCEPTABLE FOR SPUTUM CULTURE Performed at [facility], [address], [place], [place] [address]    Report  Status 05/02/2021 FINAL  Final  Culture, Respiratory w Gram Stain     Status: None (Preliminary result)   Collection Time: 05/02/21  7:40 PM  Result Value Ref Range Status   Specimen Description   Final    EXPECTORATED SPUTUM Performed at [facility], [address], [place], [place] [address]    Special Requests   Final    NONE Reflexed from [id number] Performed at [facility], [address] [place], [place] [address]    Gram Stain   Final    MODERATE WBC PRESENT, PREDOMINANTLY MONONUCLEAR FEW GRAM POSITIVE COCCI IN PAIRS RARE GRAM NEGATIVE RODS Performed at [facility], [address] [place], [place] [address]    Culture PENDING  Incomplete   Report Status PENDING  Incomplete    RADIOLOGY STUDIES/RESULTS: CT Angio Chest PE W and/or Wo Contrast  Result Date: 05/02/2021 CLINICAL DATA:  COVID.  Sore throat, cough EXAM: CT ANGIOGRAPHY CHEST WITH CONTRAST TECHNIQUE: Multidetector CT imaging of the chest was performed using the standard protocol during bolus administration of intravenous contrast. Multiplanar CT image reconstructions and MIPs were obtained to evaluate the vascular anatomy. CONTRAST:  61mL OMNIPAQUE IOHEXOL 350 MG/ML SOLN COMPARISON:  None. FINDINGS: Cardiovascular: No filling defects in the pulmonary arteries to suggest pulmonary emboli. Heart is normal size. Aorta is normal caliber. Mediastinum/Nodes: No mediastinal, hilar, or axillary adenopathy. Trachea and esophagus are unremarkable. Thyroid unremarkable. Lungs/Pleura: Patchy bilateral airspace disease, left greater than right most compatible with multifocal pneumonia. No effusions. Upper Abdomen: Imaging into the upper abdomen demonstrates no acute findings. Musculoskeletal: Chest wall soft tissues are unremarkable. No acute bony abnormality. Review of the MIP images confirms the above findings. IMPRESSION: Patchy bilateral airspace disease, left greater than right compatible with multifocal  pneumonia. No evidence of pulmonary embolus. Electronically Signed   By: Charlett Nose M.D.   On: 05/02/2021 16:19  DG Chest Portable 1 View  Result Date: 05/02/2021 CLINICAL DATA:  Body aches, fever, sore throat and cough. The patient tested positive for COVID-19. EXAM: PORTABLE CHEST 1 VIEW COMPARISON:  PA and lateral chest 07/15/2012. FINDINGS: There is patchy bilateral airspace disease. Heart size is normal. No pneumothorax or pleural effusion. IMPRESSION: Patchy bilateral airspace disease most compatible with pneumonia. Electronically Signed   By: Drusilla Kanner M.D.   On: 05/02/2021 14:09     LOS: 1 day   Jeoffrey Massed, MD  [facility]    To contact the attending provider between 7A-7P or the covering provider during after hours 7P-7A, please log into the web site [email or web address] and access using universal [place] password for that web site. If you do not have the password, please call the hospital operator.  05/03/2021, 4:13 PM

## 2021-05-03 NOTE — Plan of Care (Signed)
Pt oriented x4, remains on 3L [place]. Significant rhonchi overnight, improved with IS/acapella and proning/side-lying positions. Able to clear secretions. Independent with ambulation within room. Fall/safety precautions in place, rounding performed, needs/concerns addressed during shift.   Problem: Education: Goal: Knowledge of General Education information will improve Description: Including pain rating scale, medication(s)/side effects and non-pharmacologic comfort measures Outcome: Progressing   Problem: Health Behavior/Discharge Planning: Goal: Ability to manage health-related needs will improve Outcome: Progressing   Problem: Clinical Measurements: Goal: Ability to maintain clinical measurements within normal limits will improve Outcome: Progressing Goal: Will remain free from infection Outcome: Progressing Goal: Diagnostic test results will improve Outcome: Progressing Goal: Respiratory complications will improve Outcome: Progressing Goal: Cardiovascular complication will be avoided Outcome: Progressing   Problem: Activity: Goal: Risk for activity intolerance will decrease Outcome: Progressing   Problem: Nutrition: Goal: Adequate nutrition will be maintained Outcome: Progressing   Problem: Coping: Goal: Level of anxiety will decrease Outcome: Progressing   Problem: Elimination: Goal: Will not experience complications related to bowel motility Outcome: Progressing Goal: Will not experience complications related to urinary retention Outcome: Progressing   Problem: Pain Managment: Goal: General experience of comfort will improve Outcome: Progressing   Problem: Safety: Goal: Ability to remain free from injury will improve Outcome: Progressing   Problem: Skin Integrity: Goal: Risk for impaired skin integrity will decrease Outcome: Progressing   Problem: Education: Goal: Knowledge of risk factors and measures for prevention of condition will improve Outcome:  Progressing   Problem: Coping: Goal: Psychosocial and spiritual needs will be supported Outcome: Progressing   Problem: Respiratory: Goal: Will maintain a patent airway Outcome: Progressing

## 2021-05-04 LAB — COMPREHENSIVE METABOLIC PANEL
ALT: 19 U/L (ref 0–44)
AST: 30 U/L (ref 15–41)
Albumin: 2.9 g/dL — ABNORMAL LOW (ref 3.5–5.0)
Alkaline Phosphatase: 52 U/L (ref 38–126)
Anion gap: 7 (ref 5–15)
BUN: 22 mg/dL (ref 8–23)
CO2: 28 mmol/L (ref 22–32)
Calcium: 8.5 mg/dL — ABNORMAL LOW (ref 8.9–10.3)
Chloride: 105 mmol/L (ref 98–111)
Creatinine, Ser: 1.13 mg/dL (ref 0.61–1.24)
GFR, Estimated: >60 mL/min (ref >60)
Glucose, Bld: 161 mg/dL — ABNORMAL HIGH (ref 70–99)
Potassium: 3.7 mmol/L (ref 3.5–5.1)
Sodium: 140 mmol/L (ref 135–145)
Total Bilirubin: 0.6 mg/dL (ref 0.3–1.2)
Total Protein: 7.1 g/dL (ref 6.5–8.1)

## 2021-05-04 LAB — D-DIMER, QUANTITATIVE: D-Dimer, Quant: 0.66 ug/mL-FEU — ABNORMAL HIGH (ref 0.00–0.50)

## 2021-05-04 LAB — CBC
HCT: 39.3 % (ref 39.0–52.0)
Hemoglobin: 13.4 g/dL (ref 13.0–17.0)
MCH: 31.8 pg (ref 26.0–34.0)
MCHC: 34.1 g/dL (ref 30.0–36.0)
MCV: 93.1 fL (ref 80.0–100.0)
Platelets: 249 10*3/uL (ref 150–400)
RBC: 4.22 MIL/uL (ref 4.22–5.81)
RDW: 14.8 % (ref 11.5–15.5)
WBC: 10.4 10*3/uL (ref 4.0–10.5)
nRBC: 0 % (ref 0.0–0.2)

## 2021-05-04 LAB — C-REACTIVE PROTEIN: CRP: 22.5 mg/dL — ABNORMAL HIGH (ref <1.0)

## 2021-05-04 LAB — MAGNESIUM: Magnesium: 2.1 mg/dL (ref 1.7–2.4)

## 2021-05-04 NOTE — Progress Notes (Signed)
PROGRESS NOTE        PATIENT DETAILS Name: Sean Bailey J Kauzlarich Age: 65 y.o. Sex: male Date of Birth: 04/24/1956 Admit Date: 05/02/2021 Admitting Physician Lorretta HarpXilin Niu, MD UJW:[id number]:Miller, Hardin NegusMark F, MD  Brief Narrative: Patient is a 65 y.o. male with history of HTN, HLD, psoriasis on methotrexate who presented with cough/shortness of breath-was found to have acute hypoxic respiratory failure due to COVID-19 pneumonia +/- superimposed bacterial pneumonia.  Significant events: 8/2>> admit with SOB/cough-found to have COVID-19 pneumonia  Significant studies: 8/2>> CT angio chest: Patchy bilateral airspace disease.  No PE.  COVID-19 therapy:  Remdesivir: 8/2>> Steroids: 8/2>>  Antimicrobial therapy: Rocephin: 8/2>> Zithromax: 8/2>>  Microbiology data: 8/2>> blood culture: Negative 8/2>> sputum culture: Pending  Procedures : None  Consults: None  DVT Prophylaxis : enoxaparin (LOVENOX) injection 40 mg Start: 05/02/21 2200   Subjective: Feels much better today-continues to cough-after using incentive spirometry/flutter valve-he claims that he is able to get most of his sputum out.  Down to 1-1.5 L of oxygen.  Assessment/Plan: Acute hypoxic respiratory failure due to COVID-19 pneumonia +/- bacterial pneumonia: Hypoxia improving-down to 1-1.5 L of oxygen-cough decreasing as well-continue steroid/Remdesivir/IV antibiotics.  Psoriasis: Hold methotrexate for now-until pneumonia/COVID-19 infection improves.  HTN: BP stable-continue propranolol  Essential tremors: Stable-on propanolol  Minimally elevated troponin: Due to demand ischemia-doubt any clinical significance.  Diet: Diet Order             Diet Heart Room service appropriate? Yes; Fluid consistency: Thin  Diet effective now                    Code Status: Full code   Family Communication: None at bedside  Disposition Plan: Status is: Inpatient  Remains inpatient appropriate  because:Inpatient level of care appropriate due to severity of illness  Dispo: The patient is from: Home              Anticipated d/c is to: Home              Patient currently is not medically stable to d/c.   Difficult to place patient No    Barriers to Discharge: Hypoxia due to COVID-19 pneumonia/bacterial pneumonia-still on O2-requiring IV antimicrobial therapy  Antimicrobial agents: Anti-infectives (From admission, onward)    Start     Dose/Rate Route Frequency Ordered Stop   05/03/21 1600  cefTRIAXone (ROCEPHIN) 1 g in sodium chloride 0.9 % 100 mL IVPB        1 g 200 mL/hr over 30 Minutes Intravenous Every 24 hours 05/02/21 1628     05/03/21 1600  azithromycin (ZITHROMAX) 500 mg in sodium chloride 0.9 % 250 mL IVPB        500 mg 250 mL/hr over 60 Minutes Intravenous Every 24 hours 05/02/21 1628     05/03/21 1000  remdesivir 100 mg in sodium chloride 0.9 % 100 mL IVPB       See Hyperspace for full Linked Orders Report.   100 mg 200 mL/hr over 30 Minutes Intravenous Daily 05/02/21 1607 05/07/21 0959   05/02/21 1800  remdesivir 200 mg in sodium chloride 0.9% 250 mL IVPB       See Hyperspace for full Linked Orders Report.   200 mg 580 mL/hr over 30 Minutes Intravenous Once 05/02/21 1607 05/02/21 1921   05/02/21 1600  cefTRIAXone (ROCEPHIN) 1 g in sodium  chloride 0.9 % 100 mL IVPB        1 g 200 mL/hr over 30 Minutes Intravenous  Once 05/02/21 1558 05/02/21 1650   05/02/21 1600  azithromycin (ZITHROMAX) 500 mg in sodium chloride 0.9 % 250 mL IVPB        500 mg 250 mL/hr over 60 Minutes Intravenous  Once 05/02/21 1558 05/02/21 1749        Time spent: 25 minutes-Greater than 50% of this time was spent in counseling, explanation of diagnosis, planning of further management, and coordination of care.  MEDICATIONS: Scheduled Meds:  albuterol  2 puff Inhalation Q6H   aspirin  81 mg Oral Daily   calcipotriene   Topical q morning   cholecalciferol  2,000 Units Oral Daily    enoxaparin (LOVENOX) injection  40 mg Subcutaneous Q24H   fluticasone  1 spray Each Nare Daily   folic acid  1 mg Oral Daily   guaiFENesin  1,200 mg Oral BID   ipratropium  2 puff Inhalation Q4H   methylPREDNISolone (SOLU-MEDROL) injection  50 mg Intravenous Q12H   multivitamin with minerals   Oral Daily   propranolol  80 mg Oral BID   Continuous Infusions:  azithromycin Stopped (05/03/21 1952)   cefTRIAXone (ROCEPHIN)  IV Stopped (05/04/21 0037)   remdesivir 100 mg in NS 100 mL 100 mg (05/04/21 1000)   PRN Meds:.acetaminophen (TYLENOL) oral liquid 160 mg/5 mL, ALPRAZolam, hydrALAZINE, loratadine, ondansetron (ZOFRAN) IV   PHYSICAL EXAM: Vital signs: Vitals:   05/04/21 0000 05/04/21 0424 05/04/21 0802 05/04/21 1132  BP:  (!) 144/79 (!) 150/78 (!) 142/77  Pulse:  60 (!) 58 69  Resp: 19 15 16 16   Temp:  98.2 F (36.8 C) 98.3 F (36.8 C) 98.2 F (36.8 C)  TempSrc:      SpO2:  96% 96% 97%  Weight:      Height:       Filed Weights   05/02/21 1227  Weight: 88.5 kg   Body mass index is 29.65 kg/m.   Gen Exam:Alert awake-not in any distress HEENT:atraumatic, normocephalic Chest: Few bibasilar rales CVS:S1S2 regular Abdomen:soft non tender, non distended Extremities:no edema Neurology: Non focal Skin: no rash   I have personally reviewed following labs and imaging studies  LABORATORY DATA: CBC: Recent Labs  Lab 05/02/21 1450 05/03/21 0423 05/04/21 0401  WBC 13.7* 11.5* 10.4  NEUTROABS 11.8* 9.6*  --   HGB 13.6 13.3 13.4  HCT 38.7* 39.1 39.3  MCV 94.2 93.5 93.1  PLT 179 191 249     Basic Metabolic Panel: Recent Labs  Lab 05/02/21 1450 05/03/21 0423 05/04/21 0401  NA 136 137 140  K 3.6 4.6 3.7  CL 107 106 105  CO2 23 26 28   GLUCOSE 130* 193* 161*  BUN 15 16 22   CREATININE 1.23 1.12 1.13  CALCIUM 8.6* 8.3* 8.5*  MG 1.6*  --  2.1     GFR: Estimated Creatinine Clearance: 72.3 mL/min (by C-G formula based on SCr of 1.13 mg/dL).  Liver Function  Tests: Recent Labs  Lab 05/02/21 1450 05/03/21 0423 05/04/21 0401  AST 28 27 30   ALT 21 18 19   ALKPHOS 49 47 52  BILITOT 1.1 0.6 0.6  PROT 6.7 6.6 7.1  ALBUMIN 3.3* 2.8* 2.9*    No results for input(s): LIPASE, AMYLASE in the last 168 hours. No results for input(s): AMMONIA in the last 168 hours.  Coagulation Profile: No results for input(s): INR, PROTIME in the last 168 hours.  Cardiac Enzymes: No results for input(s): CKTOTAL, CKMB, CKMBINDEX, TROPONINI in the last 168 hours.  BNP (last 3 results) No results for input(s): PROBNP in the last 8760 hours.  Lipid Profile: Recent Labs    05/03/21 0423  CHOL 121  HDL 37*  LDLCALC 69  TRIG 73  CHOLHDL 3.3     Thyroid Function Tests: No results for input(s): TSH, T4TOTAL, FREET4, T3FREE, THYROIDAB in the last 72 hours.  Anemia Panel: Recent Labs    05/02/21 1919  FERRITIN 605*     Urine analysis: No results found for: COLORURINE, APPEARANCEUR, LABSPEC, PHURINE, GLUCOSEU, HGBUR, BILIRUBINUR, KETONESUR, PROTEINUR, UROBILINOGEN, NITRITE, LEUKOCYTESUR  Sepsis Labs: Lactic Acid, Venous    Component Value Date/Time   LATICACIDVEN 1.5 05/02/2021 1919    MICROBIOLOGY: Recent Results (from the past 240 hour(s))  Blood culture (routine single)     Status: None (Preliminary result)   Collection Time: 05/02/21  5:10 PM   Specimen: BLOOD  Result Value Ref Range Status   Specimen Description BLOOD BLOOD LEFT FOREARM  Final   Special Requests   Final    BOTTLES DRAWN AEROBIC AND ANAEROBIC Blood Culture adequate volume   Culture   Final    NO GROWTH 2 DAYS Performed at [facility], [address], [place], [place] [address]    Report Status PENDING  Incomplete  Expectorated Sputum Assessment w Gram Stain, Rflx to Resp Cult     Status: None   Collection Time: 05/02/21  7:40 PM   Specimen: Expectorated Sputum  Result Value Ref Range Status   Specimen Description EXPECTORATED SPUTUM  Final   Special  Requests NONE  Final   Sputum evaluation   Final    THIS SPECIMEN IS ACCEPTABLE FOR SPUTUM CULTURE Performed at [facility], [address] [place], [place] [address]    Report Status 05/02/2021 FINAL  Final  Culture, Respiratory w Gram Stain     Status: None (Preliminary result)   Collection Time: 05/02/21  7:40 PM  Result Value Ref Range Status   Specimen Description   Final    EXPECTORATED SPUTUM Performed at [facility], [address] [place], [place] [address]    Special Requests   Final    NONE Reflexed from [phone] Performed at [facility], [address] [place], [place] [address]    Gram Stain   Final    MODERATE WBC PRESENT, PREDOMINANTLY MONONUCLEAR FEW GRAM POSITIVE COCCI IN PAIRS RARE GRAM NEGATIVE RODS    Culture   Final    CULTURE REINCUBATED FOR BETTER GROWTH Performed at [facility], [address] [place], [place] [address]    Report Status PENDING  Incomplete    RADIOLOGY STUDIES/RESULTS: CT Angio Chest PE W and/or Wo Contrast  Result Date: 05/02/2021 CLINICAL DATA:  COVID.  Sore throat, cough EXAM: CT ANGIOGRAPHY CHEST WITH CONTRAST TECHNIQUE: Multidetector CT imaging of the chest was performed using the standard protocol during bolus administration of intravenous contrast. Multiplanar CT image reconstructions and MIPs were obtained to evaluate the vascular anatomy. CONTRAST:  48mL OMNIPAQUE IOHEXOL 350 MG/ML SOLN COMPARISON:  None. FINDINGS: Cardiovascular: No filling defects in the pulmonary arteries to suggest pulmonary emboli. Heart is normal size. Aorta is normal caliber. Mediastinum/Nodes: No mediastinal, hilar, or axillary adenopathy. Trachea and esophagus are unremarkable. Thyroid unremarkable. Lungs/Pleura: Patchy bilateral airspace disease, left greater than right most compatible with multifocal pneumonia. No effusions. Upper Abdomen: Imaging into the upper abdomen demonstrates no acute findings.  Musculoskeletal:  Chest wall soft tissues are unremarkable. No acute bony abnormality. Review of the MIP images confirms the above findings. IMPRESSION: Patchy bilateral airspace disease, left greater than right compatible with multifocal pneumonia. No evidence of pulmonary embolus. Electronically Signed   By: Charlett Nose M.D.   On: 05/02/2021 16:19     LOS: 2 days   Jeoffrey Massed, MD  [facility]    To contact the attending provider between 7A-7P or the covering provider during after hours 7P-7A, please log into the web site [email or web address] and access using universal [place] password for that web site. If you do not have the password, please call the hospital operator.  05/04/2021, 2:29 PM

## 2021-05-04 NOTE — Plan of Care (Signed)
Pt orientedx4, VSS. Weaned to 1.5L [place]. Did ambulate to bathroom and back on room air, O2 saturation check at the time reading 93-94%. Inhalers completed per MAR. Continues to have large amounts of sputum with coughing. Rhonchi on auscultation though improved from prior. Patient independently performs IS and acapella overnight. Safety precautions in place, rounding performed, needs/concerns addressed during shift.   Problem: Education: Goal: Knowledge of General Education information will improve Description: Including pain rating scale, medication(s)/side effects and non-pharmacologic comfort measures Outcome: Progressing   Problem: Health Behavior/Discharge Planning: Goal: Ability to manage health-related needs will improve Outcome: Progressing   Problem: Clinical Measurements: Goal: Ability to maintain clinical measurements within normal limits will improve Outcome: Progressing Goal: Will remain free from infection Outcome: Progressing Goal: Diagnostic test results will improve Outcome: Progressing Goal: Respiratory complications will improve Outcome: Progressing Goal: Cardiovascular complication will be avoided Outcome: Progressing   Problem: Activity: Goal: Risk for activity intolerance will decrease Outcome: Progressing   Problem: Nutrition: Goal: Adequate nutrition will be maintained Outcome: Progressing   Problem: Coping: Goal: Level of anxiety will decrease Outcome: Progressing   Problem: Elimination: Goal: Will not experience complications related to bowel motility Outcome: Progressing Goal: Will not experience complications related to urinary retention Outcome: Progressing   Problem: Pain Managment: Goal: General experience of comfort will improve Outcome: Progressing   Problem: Safety: Goal: Ability to remain free from injury will improve Outcome: Progressing   Problem: Skin Integrity: Goal: Risk for impaired skin integrity will decrease Outcome:  Progressing   Problem: Education: Goal: Knowledge of risk factors and measures for prevention of condition will improve Outcome: Progressing   Problem: Coping: Goal: Psychosocial and spiritual needs will be supported Outcome: Progressing   Problem: Respiratory: Goal: Will maintain a patent airway Outcome: Progressing Goal: Complications related to the disease process, condition or treatment will be avoided or minimized Outcome: Progressing

## 2021-05-05 ENCOUNTER — Encounter: Payer: Self-pay | Admitting: Internal Medicine

## 2021-05-05 LAB — COMPREHENSIVE METABOLIC PANEL
ALT: 20 U/L (ref 0–44)
AST: 25 U/L (ref 15–41)
Albumin: 2.5 g/dL — ABNORMAL LOW (ref 3.5–5.0)
Alkaline Phosphatase: 68 U/L (ref 38–126)
Anion gap: 5 (ref 5–15)
BUN: 24 mg/dL — ABNORMAL HIGH (ref 8–23)
CO2: 27 mmol/L (ref 22–32)
Calcium: 8.1 mg/dL — ABNORMAL LOW (ref 8.9–10.3)
Chloride: 107 mmol/L (ref 98–111)
Creatinine, Ser: 1.14 mg/dL (ref 0.61–1.24)
GFR, Estimated: >60 mL/min (ref >60)
Glucose, Bld: 164 mg/dL — ABNORMAL HIGH (ref 70–99)
Potassium: 4.3 mmol/L (ref 3.5–5.1)
Sodium: 139 mmol/L (ref 135–145)
Total Bilirubin: 0.7 mg/dL (ref 0.3–1.2)
Total Protein: 5.9 g/dL — ABNORMAL LOW (ref 6.5–8.1)

## 2021-05-05 LAB — D-DIMER, QUANTITATIVE: D-Dimer, Quant: 0.65 ug/mL-FEU — ABNORMAL HIGH (ref 0.00–0.50)

## 2021-05-05 LAB — CULTURE, RESPIRATORY W GRAM STAIN: Culture: NORMAL

## 2021-05-05 LAB — C-REACTIVE PROTEIN: CRP: 10.3 mg/dL — ABNORMAL HIGH (ref <1.0)

## 2021-05-05 MED ORDER — BENZONATATE 200 MG PO CAPS: 200.0000 mg | ORAL_CAPSULE | Freq: Three times a day (TID) | ORAL | 0 refills | Status: AC | PRN

## 2021-05-05 MED ORDER — GUAIFENESIN ER 600 MG PO TB12: 600.0000 mg | ORAL_TABLET | Freq: Two times a day (BID) | ORAL | 0 refills | Status: AC

## 2021-05-05 MED ORDER — AMOXICILLIN-POT CLAVULANATE 875-125 MG PO TABS: 1.0000 | ORAL_TABLET | Freq: Two times a day (BID) | ORAL | 0 refills | Status: AC

## 2021-05-05 MED ORDER — ALBUTEROL SULFATE HFA 108 (90 BASE) MCG/ACT IN AERS: 1.0000 | INHALATION_SPRAY | Freq: Four times a day (QID) | RESPIRATORY_TRACT | 0 refills | Status: AC | PRN

## 2021-05-05 MED ORDER — PREDNISONE 10 MG PO TABS: ORAL_TABLET | ORAL | 0 refills | Status: AC

## 2021-05-05 MED ORDER — METHOTREXATE 2.5 MG PO TABS: 15.0000 mg | ORAL_TABLET | ORAL | 0 refills | Status: AC

## 2021-05-05 NOTE — Discharge Summary (Signed)
PATIENT DETAILS Name: Sean Bailey Age: 65 y.o. Sex: male Date of Birth: 1956-09-27 MRN: [id number]. Admitting Physician: Lorretta Harp, MD LKG:[id number], Hardin Negus, MD  Admit Date: 05/02/2021 Discharge date: 05/05/2021  Recommendations for Outpatient Follow-up:  Follow up with PCP in 1-2 weeks Please obtain CMP/CBC in one week Repeat Chest Xray in 4-6 week   Admitted From:  Home  Disposition: Home   Home Health: No  Equipment/Devices: None  Discharge Condition: Stable  CODE STATUS: FULL CODE  Diet recommendation:  Diet Order             Diet - low sodium heart healthy           Diet Heart Room service appropriate? Yes; Fluid consistency: Thin  Diet effective now                    Brief Narrative: Patient is a 65 y.o. male with history of HTN, HLD, psoriasis on methotrexate who presented with cough/shortness of breath-was found to have acute hypoxic respiratory failure due to COVID-19 pneumonia +/- superimposed bacterial pneumonia.   Significant events: 8/2>> admit with SOB/cough-found to have COVID-19 pneumonia   Significant studies: 8/2>> CT angio chest: Patchy bilateral airspace disease.  No PE.   COVID-19 therapy: Remdesivir: 8/2>> 8/5 Steroids: 8/2>>   Antimicrobial therapy: Rocephin: 8/2>> 8/5 Zithromax: 8/2>> 8/5   Microbiology data: 8/2>> blood culture: Negative 8/2>> sputum culture: Pending   Procedures : None   Consults: None  Brief Hospital Course: Acute hypoxic respiratory failure due to COVID-19 pneumonia +/- bacterial pneumonia: Significantly better after being treated with steroids/remdesivir/antibiotics.  He is now on room air and requesting discharge.  He reports significant clinical improvement and feels that he is almost back to his baseline-he has apparently ambulated in the room without any major issues.  He understands that the recommended course of Remdesivir is for 5 days-but since he is clinically improved-he does not  wish to remain hospitalized any longer.  Plans are to discharge on tapering steroids, and antimicrobial therapy for a few more days.  PCP to repeat two-view chest x-ray in 4 to 6 weeks to ensure clearance of infiltrates.   Psoriasis: Methotrexate on hold-should be able to resume probably in a week or so if his clinical condition continues to improve.     HTN: BP stable-continue propranolol   Essential tremors: Stable-on propanolol   Minimally elevated troponin: Due to demand ischemia-doubt any clinical significance.  COVID-19 Labs:  Recent Labs    05/02/21 1919 05/03/21 0423 05/04/21 0401 05/05/21 0525  DDIMER 0.77* 1.11* 0.66* 0.65*  FERRITIN 605*  --   --   --   CRP 27.0* 32.6* 22.5*  --     Lab Results  Component Value Date   SARSCOV2NAA NEGATIVE 07/20/2020      Procedures/Studies: None  Discharge Diagnoses:  Principal Problem:   Pneumonia due to COVID-19 virus Active Problems:   Psoriasis   Tremors of nervous system   HTN (hypertension)   HLD (hyperlipidemia)   Acute respiratory failure with hypoxia (HCC)   Sepsis (HCC)   Elevated troponin   Hypomagnesemia   Discharge Instructions:    Person Under Monitoring Name: Sean Bailey  Location: [address] [place] [address]   Infection Prevention Recommendations for Individuals Confirmed to have, or Being Evaluated for, 2019 Novel Coronavirus (COVID-19) Infection Who Receive Care at Home  Individuals who are confirmed to have, or are being evaluated for, COVID-19 should follow the prevention  steps below until a healthcare provider or local or state health department says they can return to normal activities.  Stay home except to get medical care You should restrict activities outside your home, except for getting medical care. Do not go to work, school, or public areas, and do not use public transportation or taxis.  Call ahead before visiting your doctor Before your medical  appointment, call the healthcare provider and tell them that you have, or are being evaluated for, COVID-19 infection. This will help the healthcare provider's office take steps to keep other people from getting infected. Ask your healthcare provider to call the local or state health department.  Monitor your symptoms Seek prompt medical attention if your illness is worsening (e.g., difficulty breathing). Before going to your medical appointment, call the healthcare provider and tell them that you have, or are being evaluated for, COVID-19 infection. Ask your healthcare provider to call the local or state health department.  Wear a facemask You should wear a facemask that covers your nose and mouth when you are in the same room with other people and when you visit a healthcare provider. People who live with or visit you should also wear a facemask while they are in the same room with you.  Separate yourself from other people in your home As much as possible, you should stay in a different room from other people in your home. Also, you should use a separate bathroom, if available.  Avoid sharing household items You should not share dishes, drinking glasses, cups, eating utensils, towels, bedding, or other items with other people in your home. After using these items, you should wash them thoroughly with soap and water.  Cover your coughs and sneezes Cover your mouth and nose with a tissue when you cough or sneeze, or you can cough or sneeze into your sleeve. Throw used tissues in a lined trash can, and immediately wash your hands with soap and water for at least 20 seconds or use an alcohol-based hand rub.  Wash your [employer] your hands often and thoroughly with soap and water for at least 20 seconds. You can use an alcohol-based hand sanitizer if soap and water are not available and if your hands are not visibly dirty. Avoid touching your eyes, nose, and mouth with unwashed  hands.   Prevention Steps for Caregivers and Household Members of Individuals Confirmed to have, or Being Evaluated for, COVID-19 Infection Being Cared for in the Home  If you live with, or provide care at home for, a person confirmed to have, or being evaluated for, COVID-19 infection please follow these guidelines to prevent infection:  Follow healthcare provider's instructions Make sure that you understand and can help the patient follow any healthcare provider instructions for all care.  Provide for the patient's basic needs You should help the patient with basic needs in the home and provide support for getting groceries, prescriptions, and other personal needs.  Monitor the patient's symptoms If they are getting sicker, call his or her medical provider and tell them that the patient has, or is being evaluated for, COVID-19 infection. This will help the healthcare provider's office take steps to keep other people from getting infected. Ask the healthcare provider to call the local or state health department.  Limit the number of people who have contact with the patient If possible, have only one caregiver for the patient. Other household members should stay in another home or place of residence. If this is  not possible, they should stay in another room, or be separated from the patient as much as possible. Use a separate bathroom, if available. Restrict visitors who do not have an essential need to be in the home.  Keep older adults, very young children, and other sick people away from the patient Keep older adults, very young children, and those who have compromised immune systems or chronic health conditions away from the patient. This includes people with chronic heart, lung, or kidney conditions, diabetes, and cancer.  Ensure good ventilation Make sure that shared spaces in the home have good air flow, such as from an air conditioner or an opened window, weather  permitting.  Wash your hands often Wash your hands often and thoroughly with soap and water for at least 20 seconds. You can use an alcohol based hand sanitizer if soap and water are not available and if your hands are not visibly dirty. Avoid touching your eyes, nose, and mouth with unwashed hands. Use disposable paper towels to dry your hands. If not available, use dedicated cloth towels and replace them when they become wet.  Wear a facemask and gloves Wear a disposable facemask at all times in the room and gloves when you touch or have contact with the patient's blood, body fluids, and/or secretions or excretions, such as sweat, saliva, sputum, nasal mucus, vomit, urine, or feces.  Ensure the mask fits over your nose and mouth tightly, and do not touch it during use. Throw out disposable facemasks and gloves after using them. Do not reuse. Wash your hands immediately after removing your facemask and gloves. If your personal clothing becomes contaminated, carefully remove clothing and launder. Wash your hands after handling contaminated clothing. Place all used disposable facemasks, gloves, and other waste in a lined container before disposing them with other household waste. Remove gloves and wash your hands immediately after handling these items.  Do not share dishes, glasses, or other household items with the patient Avoid sharing household items. You should not share dishes, drinking glasses, cups, eating utensils, towels, bedding, or other items with a patient who is confirmed to have, or being evaluated for, COVID-19 infection. After the person uses these items, you should wash them thoroughly with soap and water.  Wash laundry thoroughly Immediately remove and wash clothes or bedding that have blood, body fluids, and/or secretions or excretions, such as sweat, saliva, sputum, nasal mucus, vomit, urine, or feces, on them. Wear gloves when handling laundry from the patient. Read and  follow directions on labels of laundry or clothing items and detergent. In general, wash and dry with the warmest temperatures recommended on the label.  Clean all areas the individual has used often Clean all touchable surfaces, such as counters, tabletops, doorknobs, bathroom fixtures, toilets, phones, keyboards, tablets, and bedside tables, every day. Also, clean any surfaces that may have blood, body fluids, and/or secretions or excretions on them. Wear gloves when cleaning surfaces the patient has come in contact with. Use a diluted bleach solution (e.g., dilute bleach with 1 part bleach and 10 parts water) or a household disinfectant with a label that says EPA-registered for coronaviruses. To make a bleach solution at home, add 1 tablespoon of bleach to 1 quart (4 cups) of water. For a larger supply, add  cup of bleach to 1 gallon (16 cups) of water. Read labels of cleaning products and follow recommendations provided on product labels. Labels contain instructions for safe and effective use of the cleaning product including precautions  you should take when applying the product, such as wearing gloves or eye protection and making sure you have good ventilation during use of the product. Remove gloves and wash hands immediately after cleaning.  Monitor yourself for signs and symptoms of illness Caregivers and household members are considered close contacts, should monitor their health, and will be asked to limit movement outside of the home to the extent possible. Follow the monitoring steps for close contacts listed on the symptom monitoring form.   ? If you have additional questions, contact your local health department or call the epidemiologist on call at [phone] (available 24/7). ? This guidance is subject to change. For the most up-to-date guidance from CDC, please refer to their website: [employer]hu    Activity:  As  tolerated   Discharge Instructions     Call MD for:  difficulty breathing, headache or visual disturbances   Complete by: As directed    Diet - low sodium heart healthy   Complete by: As directed    Discharge instructions   Complete by: As directed    Follow with Primary MD  Danella Penton, MD in 1-2 weeks  Please get a complete blood count and chemistry panel checked by your Primary MD at your next visit, and again as instructed by your Primary MD.  Get Medicines reviewed and adjusted: Please take all your medications with you for your next visit with your Primary MD  Laboratory/radiological data: Please request your Primary MD to go over all hospital tests and procedure/radiological results at the follow up, please ask your Primary MD to get all Hospital records sent to his/her office.  In some cases, they will be blood work, cultures and biopsy results pending at the time of your discharge. Please request that your primary care M.D. follows up on these results.  Also Note the following: If you experience worsening of your admission symptoms, develop shortness of breath, life threatening emergency, suicidal or homicidal thoughts you must seek medical attention immediately by calling 911 or calling your MD immediately  if symptoms less severe.  You must read complete instructions/literature along with all the possible adverse reactions/side effects for all the Medicines you take and that have been prescribed to you. Take any new Medicines after you have completely understood and accpet all the possible adverse reactions/side effects.   Do not drive when taking Pain medications or sleeping medications (Benzodaizepines)  Do not take more than prescribed Pain, Sleep and Anxiety Medications. It is not advisable to combine anxiety,sleep and pain medications without talking with your primary care practitioner  Special Instructions: If you have smoked or chewed Tobacco  in the last 2 yrs  please stop smoking, stop any regular Alcohol  and or any Recreational drug use.  Wear Seat belts while driving.  Please note: You were cared for by a hospitalist during your hospital stay. Once you are discharged, your primary care physician will handle any further medical issues. Please note that NO REFILLS for any discharge medications will be authorized once you are discharged, as it is imperative that you return to your primary care physician (or establish a relationship with a primary care physician if you do not have one) for your post hospital discharge needs so that they can reassess your need for medications and monitor your lab values.   Continue isolation for another 7-10 days.  Please touch base with your primary care practitioner before returning to work.  If he develop worsening shortness of breath-please  seek immediate medical attention.   Increase activity slowly   Complete by: As directed       Allergies as of 05/05/2021       Reactions   Bee Venom Anaphylaxis   Bee stings- was stung twice about 50 years and had trouble breathing   Latex         Medication List     STOP taking these medications    azithromycin 250 MG tablet Commonly known as: ZITHROMAX   meloxicam 15 MG tablet Commonly known as: MOBIC   tiZANidine 4 MG capsule Commonly known as: ZANAFLEX       TAKE these medications    acetaminophen 650 MG CR tablet Commonly known as: TYLENOL Take 1,300 mg by mouth daily as needed for pain.   albuterol 108 (90 Base) MCG/ACT inhaler Commonly known as: VENTOLIN HFA Inhale 1-2 puffs into the lungs every 6 (six) hours as needed for wheezing or shortness of breath.   amoxicillin-clavulanate 875-125 MG tablet Commonly known as: Augmentin Take 1 tablet by mouth 2 (two) times daily for 2 days.   aspirin 81 MG tablet Take 81 mg by mouth daily.   benzonatate 200 MG capsule Commonly known as: TESSALON Take 1 capsule (200 mg total) by mouth 3 (three)  times daily as needed for up to 7 days.   calcipotriene 0.005 % cream Commonly known as: DOVONOX Apply topically.   Cetirizine HCl 10 MG Caps Take 10 mg by mouth daily as needed.   etodolac 400 MG tablet Commonly known as: LODINE Take 400 mg by mouth 2 (two) times daily.   folic acid 1 MG tablet Commonly known as: FOLVITE Take 1 mg by mouth daily.   guaiFENesin 600 MG 12 hr tablet Commonly known as: MUCINEX Take 1 tablet (600 mg total) by mouth 2 (two) times daily for 5 days.   LACTASE PO Take by mouth.   methotrexate 2.5 MG tablet Commonly known as: RHEUMATREX Take 6 tablets (15 mg total) by mouth every Wednesday. Start taking on: May 17, 2021 What changed: These instructions start on May 17, 2021. If you are unsure what to do until then, ask your doctor or other care provider.   MULTIPLE VITAMIN PO Take 1 tablet by mouth daily.   predniSONE 10 MG tablet Commonly known as: DELTASONE Take 40 mg daily for 1 day, 30 mg daily for 1 day, 20 mg daily for 1 days,10 mg daily for 1 day, then stop   propranolol 80 MG tablet Commonly known as: INDERAL Take 80 mg by mouth 2 (two) times daily.   Vitamin D 50 MCG (2000 UT) tablet Take 2,000 Units by mouth daily.        Follow-up Information     Danella Penton, MD. Schedule an appointment as soon as possible for a visit in 1 week(s).   Specialty: Internal Medicine Contact information: [phone] [facility] [address] [facility] [place] [facility] [place] [address] [phone]                Allergies  Allergen Reactions   Bee Venom Anaphylaxis    Bee stings- was stung twice about 50 years and had trouble breathing   Latex       Consultations:  None   Other Procedures/Studies: CT Angio Chest PE W and/or Wo Contrast  Result Date: 05/02/2021 CLINICAL DATA:  COVID.  Sore throat, cough EXAM: CT ANGIOGRAPHY CHEST WITH CONTRAST TECHNIQUE: Multidetector CT imaging of the chest was performed using the  standard protocol during bolus administration of intravenous contrast. Multiplanar CT image reconstructions and MIPs were obtained to evaluate the vascular anatomy. CONTRAST:  75mL OMNIPAQUE IOHEXOL 350 MG/ML SOLN COMPARISON:  None. FINDINGS: Cardiovascular: No filling defects in the pulmonary arteries to suggest pulmonary emboli. Heart is normal size. Aorta is normal caliber. Mediastinum/Nodes: No mediastinal, hilar, or axillary adenopathy. Trachea and esophagus are unremarkable. Thyroid unremarkable. Lungs/Pleura: Patchy bilateral airspace disease, left greater than right most compatible with multifocal pneumonia. No effusions. Upper Abdomen: Imaging into the upper abdomen demonstrates no acute findings. Musculoskeletal: Chest wall soft tissues are unremarkable. No acute bony abnormality. Review of the MIP images confirms the above findings. IMPRESSION: Patchy bilateral airspace disease, left greater than right compatible with multifocal pneumonia. No evidence of pulmonary embolus. Electronically Signed   By: Charlett Nose M.D.   On: 05/02/2021 16:19   DG Chest Portable 1 View  Result Date: 05/02/2021 CLINICAL DATA:  Body aches, fever, sore throat and cough. The patient tested positive for COVID-19. EXAM: PORTABLE CHEST 1 VIEW COMPARISON:  PA and lateral chest 07/15/2012. FINDINGS: There is patchy bilateral airspace disease. Heart size is normal. No pneumothorax or pleural effusion. IMPRESSION: Patchy bilateral airspace disease most compatible with pneumonia. Electronically Signed   By: Drusilla Kanner M.D.   On: 05/02/2021 14:09     TODAY-DAY OF DISCHARGE:  Subjective:   Arlyce Dice today has no headache,no chest abdominal pain,no new weakness tingling or numbness, feels much better wants to go home today.   Objective:   Blood pressure (!) 150/86, pulse (!) 56, temperature 97.8 F (36.6 C), temperature source Oral, resp. rate 18, height  (1.727 m), weight 88.5 kg, SpO2 94  %.  Intake/Output Summary (Last 24 hours) at 05/05/2021 0957 Last data filed at 05/04/2021 1945 Gross per 24 hour  Intake 450 ml  Output --  Net 450 ml   Filed Weights   05/02/21 1227  Weight: 88.5 kg    Exam: Awake Alert, Oriented *3, No new F.N deficits, Normal affect [place].AT,PERRAL Supple Neck,No JVD, No cervical lymphadenopathy appriciated.  Symmetrical Chest wall movement, Good air movement bilaterally, CTAB RRR,No Gallops,Rubs or new Murmurs, No Parasternal Heave +ve B.Sounds, Abd Soft, Non tender, No organomegaly appriciated, No rebound -guarding or rigidity. No Cyanosis, Clubbing or edema, No new Rash or bruise   PERTINENT RADIOLOGIC STUDIES: CT Angio Chest PE W and/or Wo Contrast  Result Date: 05/02/2021 CLINICAL DATA:  COVID.  Sore throat, cough EXAM: CT ANGIOGRAPHY CHEST WITH CONTRAST TECHNIQUE: Multidetector CT imaging of the chest was performed using the standard protocol during bolus administration of intravenous contrast. Multiplanar CT image reconstructions and MIPs were obtained to evaluate the vascular anatomy. CONTRAST:  75mL OMNIPAQUE IOHEXOL 350 MG/ML SOLN COMPARISON:  None. FINDINGS: Cardiovascular: No filling defects in the pulmonary arteries to suggest pulmonary emboli. Heart is normal size. Aorta is normal caliber. Mediastinum/Nodes: No mediastinal, hilar, or axillary adenopathy. Trachea and esophagus are unremarkable. Thyroid unremarkable. Lungs/Pleura: Patchy bilateral airspace disease, left greater than right most compatible with multifocal pneumonia. No effusions. Upper Abdomen: Imaging into the upper abdomen demonstrates no acute findings. Musculoskeletal: Chest wall soft tissues are unremarkable. No acute bony abnormality. Review of the MIP images confirms the above findings. IMPRESSION: Patchy bilateral airspace disease, left greater than right compatible with multifocal pneumonia. No evidence of pulmonary embolus. Electronically Signed   By: Charlett Nose M.D.    On: 05/02/2021 16:19   DG Chest Portable 1 View  Result Date: 05/02/2021 CLINICAL DATA:  Body aches, fever, sore throat and cough. The patient tested positive for COVID-19. EXAM: PORTABLE CHEST 1 VIEW COMPARISON:  PA and lateral chest 07/15/2012. FINDINGS: There is patchy bilateral airspace disease. Heart size is normal. No pneumothorax or pleural effusion. IMPRESSION: Patchy bilateral airspace disease most compatible with pneumonia. Electronically Signed   By: Drusilla Kanner M.D.   On: 05/02/2021 14:09     PERTINENT LAB RESULTS: CBC: Recent Labs    05/03/21 0423 05/04/21 0401  WBC 11.5* 10.4  HGB 13.3 13.4  HCT 39.1 39.3  PLT 191 249   CMET CMP     Component Value Date/Time   NA 139 05/05/2021 0525   NA 142 07/15/2012 1521   K 4.3 05/05/2021 0525   K 4.5 07/15/2012 1521   CL 107 05/05/2021 0525   CL 107 07/15/2012 1521   CO2 27 05/05/2021 0525   CO2 29 07/15/2012 1521   GLUCOSE 164 (H) 05/05/2021 0525   GLUCOSE 88 07/15/2012 1521   BUN 24 (H) 05/05/2021 0525   BUN 11 07/15/2012 1521   CREATININE 1.14 05/05/2021 0525   CREATININE 1.11 08/29/2016 0901   CALCIUM 8.1 (L) 05/05/2021 0525   CALCIUM 8.5 07/15/2012 1521   PROT 5.9 (L) 05/05/2021 0525   PROT 7.7 07/15/2012 1521   ALBUMIN 2.5 (L) 05/05/2021 0525   ALBUMIN 4.0 07/15/2012 1521   AST 25 05/05/2021 0525   AST 26 07/15/2012 1521   ALT 20 05/05/2021 0525   ALT 28 07/15/2012 1521   ALKPHOS 68 05/05/2021 0525   ALKPHOS 78 07/15/2012 1521   BILITOT 0.7 05/05/2021 0525   BILITOT 0.3 07/15/2012 1521   GFRNONAA >60 05/05/2021 0525   GFRNONAA 73 08/29/2016 0901   GFRAA 84 08/29/2016 0901    GFR Estimated Creatinine Clearance: 71.7 mL/min (by C-G formula based on SCr of 1.14 mg/dL). No results for input(s): LIPASE, AMYLASE in the last 72 hours. No results for input(s): CKTOTAL, CKMB, CKMBINDEX, TROPONINI in the last 72 hours. Invalid input(s): POCBNP Recent Labs    05/04/21 0401 05/05/21 0525  DDIMER 0.66*  0.65*   Recent Labs    05/02/21 1710  HGBA1C 5.5   Recent Labs    05/03/21 0423  CHOL 121  HDL 37*  LDLCALC 69  TRIG 73  CHOLHDL 3.3   No results for input(s): TSH, T4TOTAL, T3FREE, THYROIDAB in the last 72 hours.  Invalid input(s): FREET3 Recent Labs    05/02/21 1919  FERRITIN 605*   Coags: No results for input(s): INR in the last 72 hours.  Invalid input(s): PT Microbiology: Recent Results (from the past 240 hour(s))  Blood culture (routine single)     Status: None (Preliminary result)   Collection Time: 05/02/21  5:10 PM   Specimen: BLOOD  Result Value Ref Range Status   Specimen Description BLOOD BLOOD LEFT FOREARM  Final   Special Requests   Final    BOTTLES DRAWN AEROBIC AND ANAEROBIC Blood Culture adequate volume   Culture   Final    NO GROWTH 3 DAYS Performed at [facility], [address], [place], [place] [address]    Report Status PENDING  Incomplete  Expectorated Sputum Assessment w Gram Stain, Rflx to Resp Cult     Status: None   Collection Time: 05/02/21  7:40 PM   Specimen: Expectorated Sputum  Result Value Ref Range Status   Specimen Description EXPECTORATED SPUTUM  Final   Special Requests NONE  Final   Sputum evaluation   Final  THIS SPECIMEN IS ACCEPTABLE FOR SPUTUM CULTURE Performed at [facility], [address] [place], [place] [address]    Report Status 05/02/2021 FINAL  Final  Culture, Respiratory w Gram Stain     Status: None   Collection Time: 05/02/21  7:40 PM  Result Value Ref Range Status   Specimen Description   Final    EXPECTORATED SPUTUM Performed at [facility], [address] [place], [place] [address]    Special Requests   Final    NONE Reflexed from [id number] Performed at [facility], [address] [place], [place] [address]    Gram Stain   Final    MODERATE WBC PRESENT, PREDOMINANTLY MONONUCLEAR FEW GRAM POSITIVE COCCI IN PAIRS RARE GRAM NEGATIVE RODS    Culture    Final    FEW Normal respiratory flora-no Staph aureus or Pseudomonas seen Performed at [facility], [address] [place], [place] [address]    Report Status 05/05/2021 FINAL  Final    FURTHER DISCHARGE INSTRUCTIONS:  Get Medicines reviewed and adjusted: Please take all your medications with you for your next visit with your Primary MD  Laboratory/radiological data: Please request your Primary MD to go over all hospital tests and procedure/radiological results at the follow up, please ask your Primary MD to get all Hospital records sent to his/her office.  In some cases, they will be blood work, cultures and biopsy results pending at the time of your discharge. Please request that your primary care M.D. goes through all the records of your hospital data and follows up on these results.  Also Note the following: If you experience worsening of your admission symptoms, develop shortness of breath, life threatening emergency, suicidal or homicidal thoughts you must seek medical attention immediately by calling 911 or calling your MD immediately  if symptoms less severe.  You must read complete instructions/literature along with all the possible adverse reactions/side effects for all the Medicines you take and that have been prescribed to you. Take any new Medicines after you have completely understood and accpet all the possible adverse reactions/side effects.   Do not drive when taking Pain medications or sleeping medications (Benzodaizepines)  Do not take more than prescribed Pain, Sleep and Anxiety Medications. It is not advisable to combine anxiety,sleep and pain medications without talking with your primary care practitioner  Special Instructions: If you have smoked or chewed Tobacco  in the last 2 yrs please stop smoking, stop any regular Alcohol  and or any Recreational drug use.  Wear Seat belts while driving.  Please note: You were cared for by a hospitalist during your  hospital stay. Once you are discharged, your primary care physician will handle any further medical issues. Please note that NO REFILLS for any discharge medications will be authorized once you are discharged, as it is imperative that you return to your primary care physician (or establish a relationship with a primary care physician if you do not have one) for your post hospital discharge needs so that they can reassess your need for medications and monitor your lab values.  Total Time spent coordinating discharge including counseling, education and face to face time equals 45 minutes.  SignedJeoffrey Massed: Sander Remedios 05/05/2021 9:57 AM

## 2021-05-05 NOTE — Progress Notes (Signed)
Patient discharge education provided. All questions answered, all belongings sent home with patient. IV removed, vital signs stable.

## 2021-05-05 NOTE — Plan of Care (Signed)
Pt oriented x4, VSS, weaned to RA overnight. Lung sounds much improved from prior, continues to use incentive spirometer and acapella independently. Independent in room. NSR/SB on telemetry. Fall/safety precautions in place, rounding performed.   Problem: Education: Goal: Knowledge of General Education information will improve Description: Including pain rating scale, medication(s)/side effects and non-pharmacologic comfort measures Outcome: Progressing   Problem: Health Behavior/Discharge Planning: Goal: Ability to manage health-related needs will improve Outcome: Progressing   Problem: Clinical Measurements: Goal: Ability to maintain clinical measurements within normal limits will improve Outcome: Progressing Goal: Will remain free from infection Outcome: Progressing Goal: Diagnostic test results will improve Outcome: Progressing Goal: Respiratory complications will improve Outcome: Progressing Goal: Cardiovascular complication will be avoided Outcome: Progressing   Problem: Activity: Goal: Risk for activity intolerance will decrease Outcome: Progressing   Problem: Nutrition: Goal: Adequate nutrition will be maintained Outcome: Progressing   Problem: Coping: Goal: Level of anxiety will decrease Outcome: Progressing   Problem: Elimination: Goal: Will not experience complications related to bowel motility Outcome: Progressing Goal: Will not experience complications related to urinary retention Outcome: Progressing   Problem: Pain Managment: Goal: General experience of comfort will improve Outcome: Progressing   Problem: Safety: Goal: Ability to remain free from injury will improve Outcome: Progressing   Problem: Skin Integrity: Goal: Risk for impaired skin integrity will decrease Outcome: Progressing   Problem: Education: Goal: Knowledge of risk factors and measures for prevention of condition will improve Outcome: Progressing

## 2021-05-05 NOTE — Discharge Instructions (Signed)
Person Under Monitoring Name: Sean Bailey  Location: [address] [place] [address]   Infection Prevention Recommendations for Individuals Confirmed to have, or Being Evaluated for, 2019 Novel Coronavirus (COVID-19) Infection Who Receive Care at Home  Individuals who are confirmed to have, or are being evaluated for, COVID-19 should follow the prevention steps below until a healthcare provider or local or state health department says they can return to normal activities.  Stay home except to get medical care You should restrict activities outside your home, except for getting medical care. Do not go to work, school, or public areas, and do not use public transportation or taxis.  Call ahead before visiting your doctor Before your medical appointment, call the healthcare provider and tell them that you have, or are being evaluated for, COVID-19 infection. This will help the healthcare provider's office take steps to keep other people from getting infected. Ask your healthcare provider to call the local or state health department.  Monitor your symptoms Seek prompt medical attention if your illness is worsening (e.g., difficulty breathing). Before going to your medical appointment, call the healthcare provider and tell them that you have, or are being evaluated for, COVID-19 infection. Ask your healthcare provider to call the local or state health department.  Wear a facemask You should wear a facemask that covers your nose and mouth when you are in the same room with other people and when you visit a healthcare provider. People who live with or visit you should also wear a facemask while they are in the same room with you.  Separate yourself from other people in your home As much as possible, you should stay in a different room from other people in your home. Also, you should use a separate bathroom, if available.  Avoid sharing household items You should  not share dishes, drinking glasses, cups, eating utensils, towels, bedding, or other items with other people in your home. After using these items, you should wash them thoroughly with soap and water.  Cover your coughs and sneezes Cover your mouth and nose with a tissue when you cough or sneeze, or you can cough or sneeze into your sleeve. Throw used tissues in a lined trash can, and immediately wash your hands with soap and water for at least 20 seconds or use an alcohol-based hand rub.  Wash your [employer] your hands often and thoroughly with soap and water for at least 20 seconds. You can use an alcohol-based hand sanitizer if soap and water are not available and if your hands are not visibly dirty. Avoid touching your eyes, nose, and mouth with unwashed hands.   Prevention Steps for Caregivers and Household Members of Individuals Confirmed to have, or Being Evaluated for, COVID-19 Infection Being Cared for in the Home  If you live with, or provide care at home for, a person confirmed to have, or being evaluated for, COVID-19 infection please follow these guidelines to prevent infection:  Follow healthcare provider's instructions Make sure that you understand and can help the patient follow any healthcare provider instructions for all care.  Provide for the patient's basic needs You should help the patient with basic needs in the home and provide support for getting groceries, prescriptions, and other personal needs.  Monitor the patient's symptoms If they are getting sicker, call his or her medical provider and tell them that the patient has, or is being evaluated for, COVID-19 infection. This will help the healthcare provider's office  take steps to keep other people from getting infected. Ask the healthcare provider to call the local or state health department.  Limit the number of people who have contact with the patient If possible, have only one caregiver for the  patient. Other household members should stay in another home or place of residence. If this is not possible, they should stay in another room, or be separated from the patient as much as possible. Use a separate bathroom, if available. Restrict visitors who do not have an essential need to be in the home.  Keep older adults, very young children, and other sick people away from the patient Keep older adults, very young children, and those who have compromised immune systems or chronic health conditions away from the patient. This includes people with chronic heart, lung, or kidney conditions, diabetes, and cancer.  Ensure good ventilation Make sure that shared spaces in the home have good air flow, such as from an air conditioner or an opened window, weather permitting.  Wash your hands often Wash your hands often and thoroughly with soap and water for at least 20 seconds. You can use an alcohol based hand sanitizer if soap and water are not available and if your hands are not visibly dirty. Avoid touching your eyes, nose, and mouth with unwashed hands. Use disposable paper towels to dry your hands. If not available, use dedicated cloth towels and replace them when they become wet.  Wear a facemask and gloves Wear a disposable facemask at all times in the room and gloves when you touch or have contact with the patient's blood, body fluids, and/or secretions or excretions, such as sweat, saliva, sputum, nasal mucus, vomit, urine, or feces.  Ensure the mask fits over your nose and mouth tightly, and do not touch it during use. Throw out disposable facemasks and gloves after using them. Do not reuse. Wash your hands immediately after removing your facemask and gloves. If your personal clothing becomes contaminated, carefully remove clothing and launder. Wash your hands after handling contaminated clothing. Place all used disposable facemasks, gloves, and other waste in a lined container before  disposing them with other household waste. Remove gloves and wash your hands immediately after handling these items.  Do not share dishes, glasses, or other household items with the patient Avoid sharing household items. You should not share dishes, drinking glasses, cups, eating utensils, towels, bedding, or other items with a patient who is confirmed to have, or being evaluated for, COVID-19 infection. After the person uses these items, you should wash them thoroughly with soap and water.  Wash laundry thoroughly Immediately remove and wash clothes or bedding that have blood, body fluids, and/or secretions or excretions, such as sweat, saliva, sputum, nasal mucus, vomit, urine, or feces, on them. Wear gloves when handling laundry from the patient. Read and follow directions on labels of laundry or clothing items and detergent. In general, wash and dry with the warmest temperatures recommended on the label.  Clean all areas the individual has used often Clean all touchable surfaces, such as counters, tabletops, doorknobs, bathroom fixtures, toilets, phones, keyboards, tablets, and bedside tables, every day. Also, clean any surfaces that may have blood, body fluids, and/or secretions or excretions on them. Wear gloves when cleaning surfaces the patient has come in contact with. Use a diluted bleach solution (e.g., dilute bleach with 1 part bleach and 10 parts water) or a household disinfectant with a label that says EPA-registered for coronaviruses. To make a bleach  solution at home, add 1 tablespoon of bleach to 1 quart (4 cups) of water. For a larger supply, add  cup of bleach to 1 gallon (16 cups) of water. Read labels of cleaning products and follow recommendations provided on product labels. Labels contain instructions for safe and effective use of the cleaning product including precautions you should take when applying the product, such as wearing gloves or eye protection and making sure you  have good ventilation during use of the product. Remove gloves and wash hands immediately after cleaning.  Monitor yourself for signs and symptoms of illness Caregivers and household members are considered close contacts, should monitor their health, and will be asked to limit movement outside of the home to the extent possible. Follow the monitoring steps for close contacts listed on the symptom monitoring form.   ? If you have additional questions, contact your local health department or call the epidemiologist on call at [phone] (available 24/7). ? This guidance is subject to change. For the most up-to-date guidance from [facility], please refer to their website: [email or web address].pl

## 2021-05-07 LAB — CULTURE, BLOOD (SINGLE)
Culture: NO GROWTH
Special Requests: ADEQUATE

## 2021-11-21 IMAGING — DX DG CHEST 1V PORT
1 series · 1 of 1 positions shown · non-contrast
Comparison: PA and lateral chest 07/15/2012.

CLINICAL DATA: Body aches, fever, sore throat and cough. The
patient tested positive for [id number].

EXAM:
PORTABLE CHEST 1 VIEW

[chest ap]
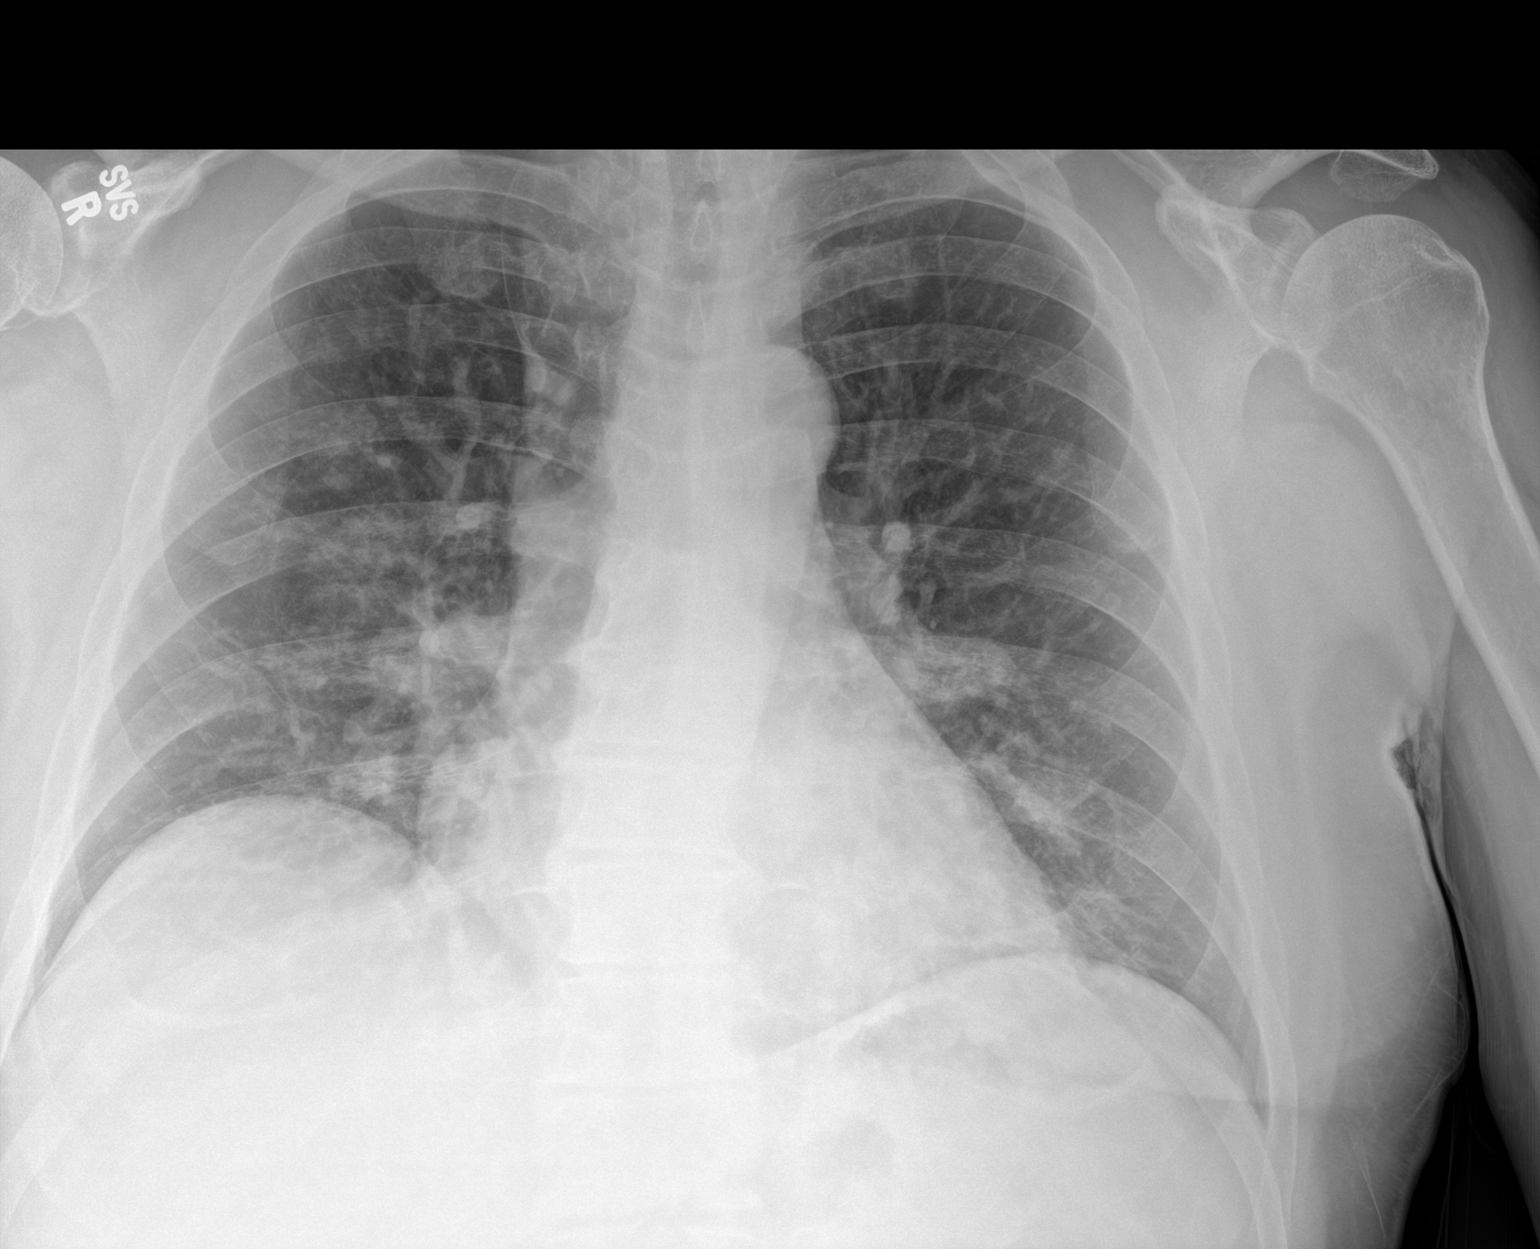

[1 of 1 positions shown; findings below may reference images not displayed]

FINDINGS: There is patchy bilateral airspace disease. Heart size is normal. No
pneumothorax or pleural effusion.
IMPRESSION: Patchy bilateral airspace disease most compatible with pneumonia.

## 2021-11-21 IMAGING — CT CT ANGIO CHEST
2 of 6 series · 19 of 46 positions shown · IV contrast (APPLIED)
Comparison: None.

CLINICAL DATA: COVID.  Sore throat, cough

EXAM:
CT ANGIOGRAPHY CHEST WITH CONTRAST
TECHNIQUE: Multidetector CT imaging of the chest was performed using the
standard protocol during bolus administration of intravenous
contrast. Multiplanar CT image reconstructions and MIPs were
obtained to evaluate the vascular anatomy.
CONTRAST:  75mL OMNIPAQUE IOHEXOL 350 MG/ML SOLN

[Series 5: thins · axial · 0.84mm/px · z∈[+406,+675]mm · 17 of 295 slices shown]
[im 13/295  lung]
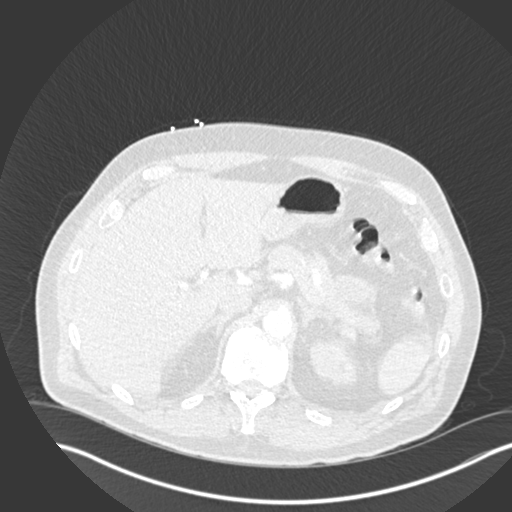
[im 26/295  soft-tissue]
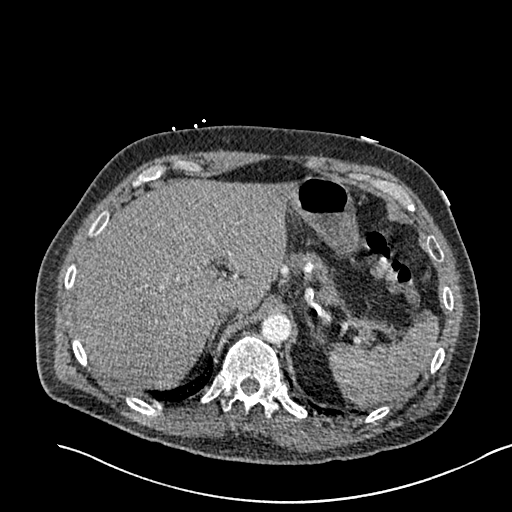
[im 52/295  lung]
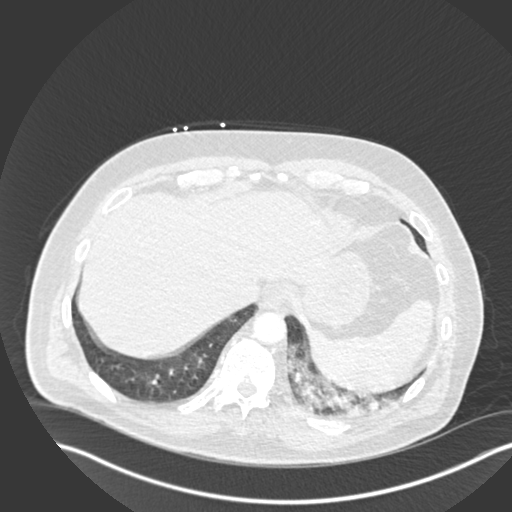
[im 64/295  soft-tissue]
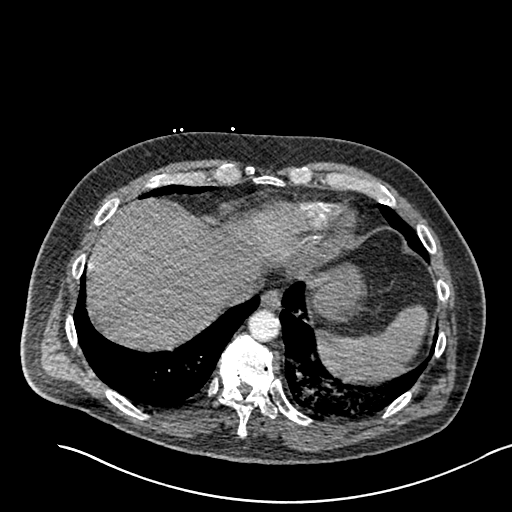
[im 77/295  lung]
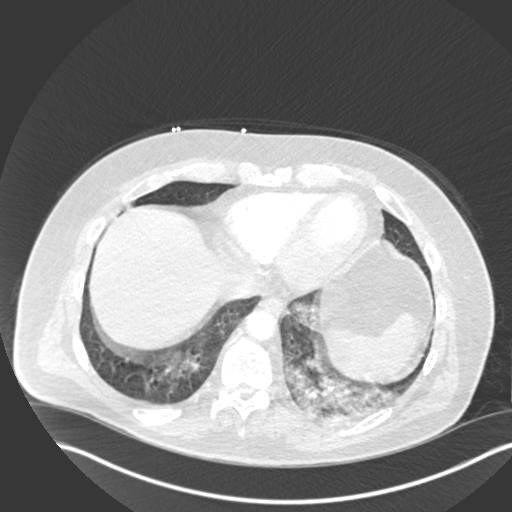
[im 103/295  soft-tissue]
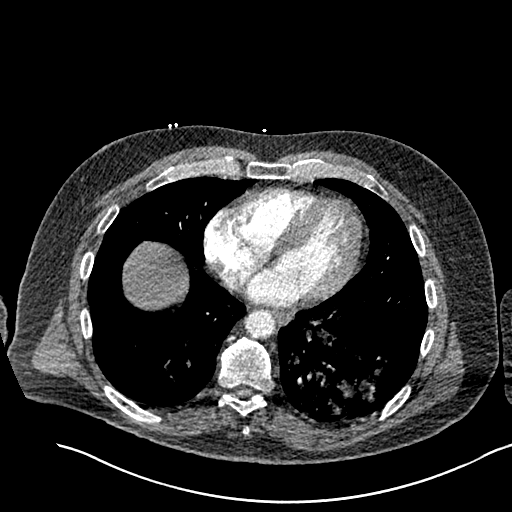
[im 116/295  lung]
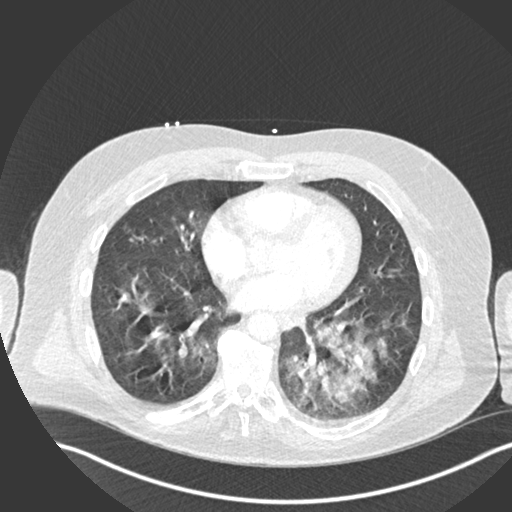
[im 128/295  soft-tissue]
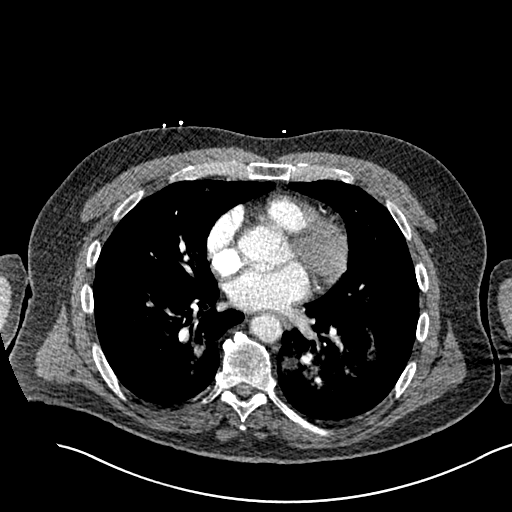
[im 154/295  lung]
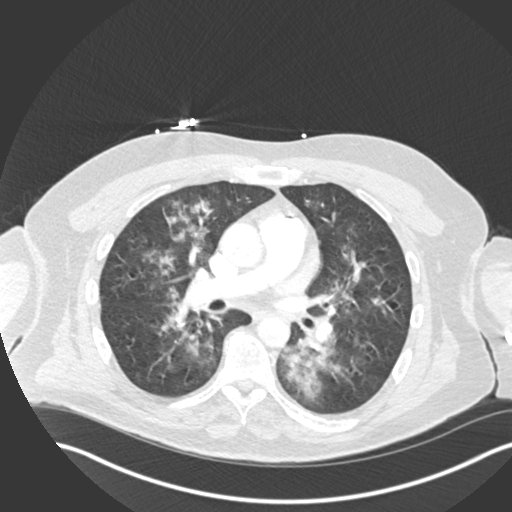
[im 167/295  soft-tissue]
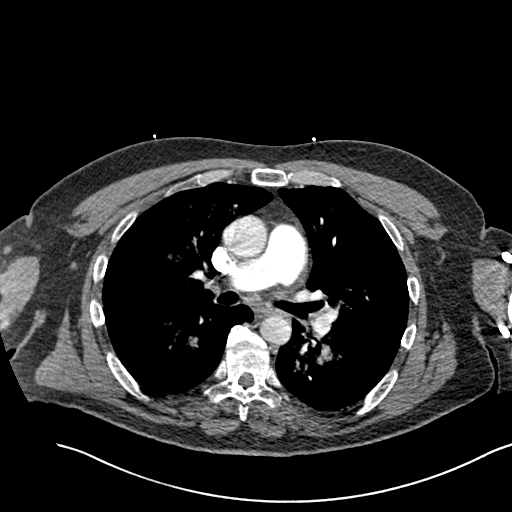
[im 179/295  lung]
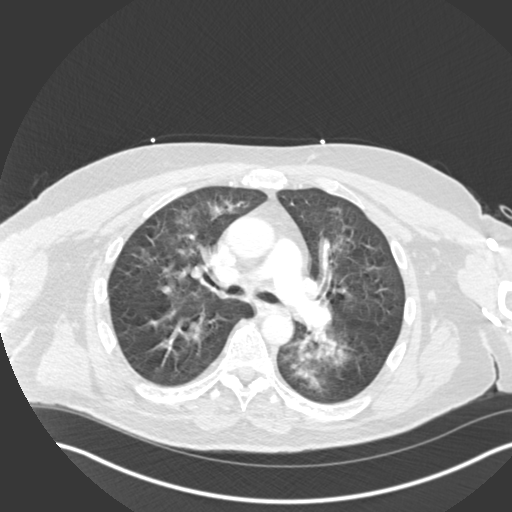
[im 192/295  soft-tissue]
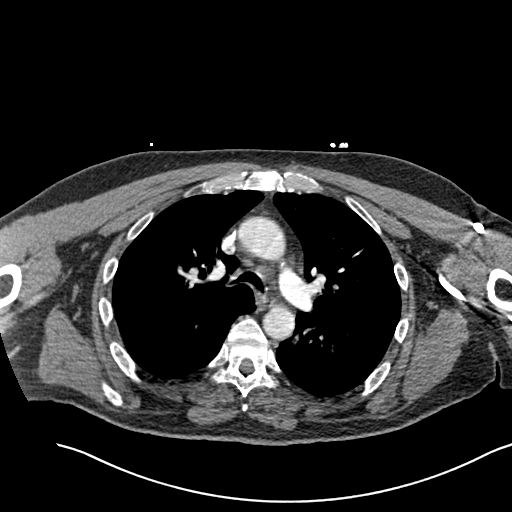
[im 218/295  lung]
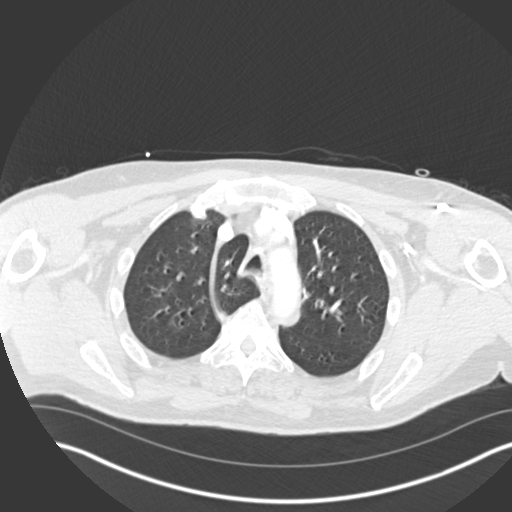
[im 231/295  soft-tissue]
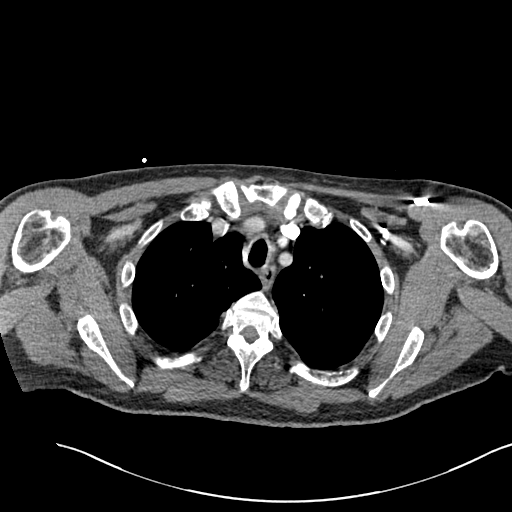
[im 243/295  lung]
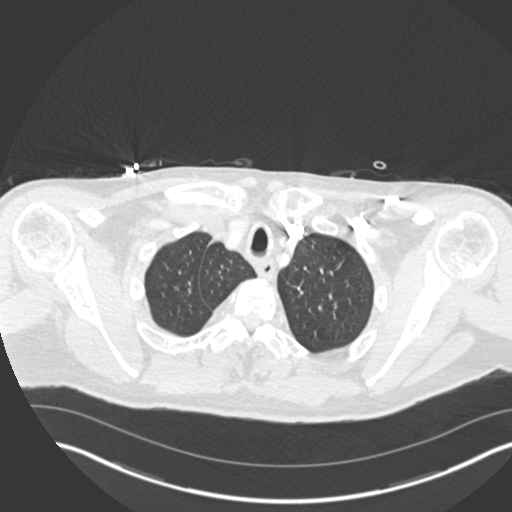
[im 269/295  soft-tissue]
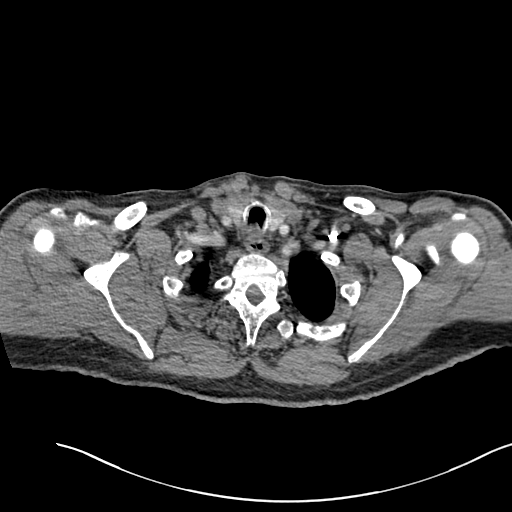
[im 282/295  lung]
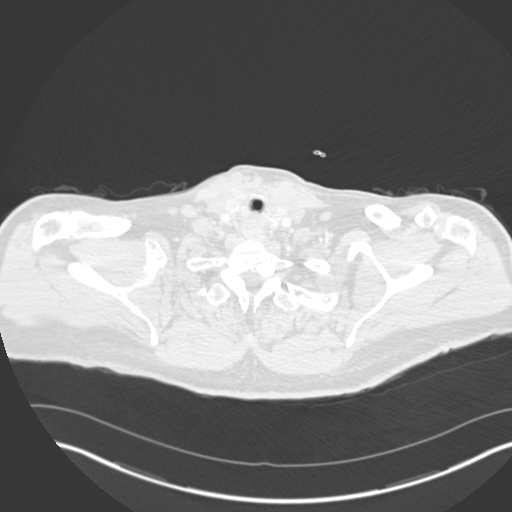

[Series 7: coronal mpr · coronal · 0.58mm/px · 2 of 91 slices shown]
[im 31/91  soft-tissue]
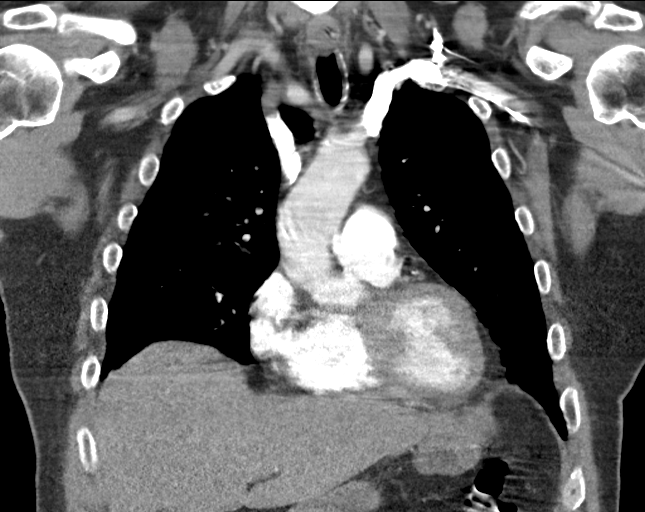
[im 61/91  soft-tissue]
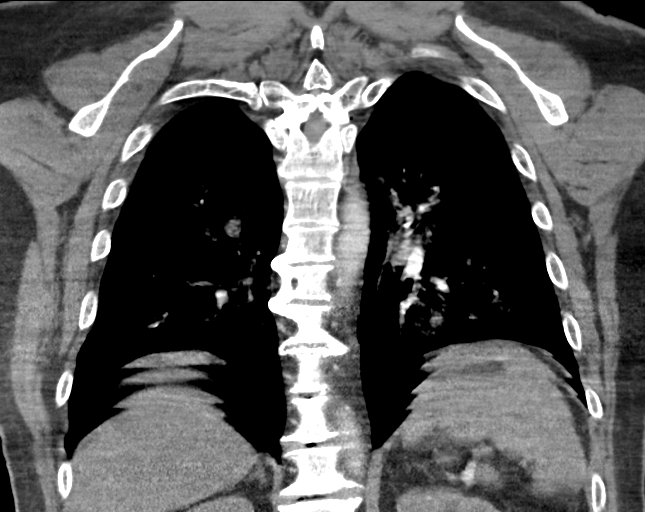

[19 of 46 positions shown; findings below may reference images not displayed]

FINDINGS: Cardiovascular: No filling defects in the pulmonary arteries to
suggest pulmonary emboli. Heart is normal size. Aorta is normal
caliber.

Mediastinum/Nodes: No mediastinal, hilar, or axillary adenopathy.
Trachea and esophagus are unremarkable. Thyroid unremarkable.

Lungs/Pleura: Patchy bilateral airspace disease, left greater than
right most compatible with multifocal pneumonia. No effusions.

Upper Abdomen: Imaging into the upper abdomen demonstrates no acute
findings.

Musculoskeletal: Chest wall soft tissues are unremarkable. No acute
bony abnormality.

Review of the MIP images confirms the above findings.
IMPRESSION: Patchy bilateral airspace disease, left greater than right
compatible with multifocal pneumonia.

No evidence of pulmonary embolus.

## 2022-08-28 ENCOUNTER — Other Ambulatory Visit: Payer: Self-pay | Admitting: Internal Medicine

## 2022-08-28 DIAGNOSIS — Z8042 Family history of malignant neoplasm of prostate: Secondary | ICD-10-CM

## 2022-08-28 DIAGNOSIS — R972 Elevated prostate specific antigen [PSA]: Secondary | ICD-10-CM

## 2022-09-05 ENCOUNTER — Ambulatory Visit
Admission: RE | Admit: 2022-09-05 | Discharge: 2022-09-05 | Disposition: A | Discharge status: Home / Self Care | Payer: 59 | Source: Ambulatory Visit | Attending: Internal Medicine | Admitting: Internal Medicine

## 2022-09-05 DIAGNOSIS — R972 Elevated prostate specific antigen [PSA]: Secondary | ICD-10-CM

## 2022-09-05 DIAGNOSIS — Z8042 Family history of malignant neoplasm of prostate: Secondary | ICD-10-CM | POA: Diagnosis present

## 2022-09-05 MED ORDER — GADOBUTROL 1 MMOL/ML IV SOLN
8.0000 mL | Freq: Once | INTRAVENOUS | Status: AC | PRN
  Administered 2022-09-05: 8 mL via INTRAVENOUS

## 2023-02-06 ENCOUNTER — Other Ambulatory Visit: Payer: Self-pay | Admitting: Internal Medicine

## 2023-02-06 DIAGNOSIS — R079 Chest pain, unspecified: Secondary | ICD-10-CM

## 2023-02-06 DIAGNOSIS — R0609 Other forms of dyspnea: Secondary | ICD-10-CM

## 2023-02-12 ENCOUNTER — Ambulatory Visit
Admission: RE | Admit: 2023-02-12 | Discharge: 2023-02-12 | Disposition: A | Discharge status: Home / Self Care | Payer: BC Managed Care – PPO | Source: Ambulatory Visit | Attending: Internal Medicine | Admitting: Internal Medicine

## 2023-02-12 DIAGNOSIS — R0609 Other forms of dyspnea: Secondary | ICD-10-CM

## 2023-02-12 DIAGNOSIS — R079 Chest pain, unspecified: Secondary | ICD-10-CM

## 2023-02-12 MED ORDER — IOHEXOL 350 MG/ML SOLN
100.0000 mL | Freq: Once | INTRAVENOUS | Status: AC | PRN
  Administered 2023-02-12: 80 mL via INTRAVENOUS

## 2023-03-22 ENCOUNTER — Other Ambulatory Visit: Payer: Self-pay

## 2023-03-22 DIAGNOSIS — R079 Chest pain, unspecified: Secondary | ICD-10-CM

## 2023-03-22 DIAGNOSIS — I2510 Atherosclerotic heart disease of native coronary artery without angina pectoris: Secondary | ICD-10-CM

## 2023-04-01 ENCOUNTER — Telehealth (HOSPITAL_COMMUNITY): Payer: Self-pay | Admitting: *Deleted

## 2023-04-01 NOTE — Telephone Encounter (Signed)
Attempted to call patient regarding upcoming cardiac CT appointment. °Left message on voicemail with name and callback number ° °Saroya Riccobono RN Navigator Cardiac Imaging ° Heart and Vascular Services °[phone] Office °[phone] Cell ° °

## 2023-04-02 ENCOUNTER — Encounter (HOSPITAL_COMMUNITY): Payer: Self-pay

## 2023-04-03 ENCOUNTER — Other Ambulatory Visit: Payer: Self-pay | Admitting: Cardiology

## 2023-04-03 ENCOUNTER — Ambulatory Visit
Admission: RE | Admit: 2023-04-03 | Discharge: 2023-04-03 | Disposition: A | Discharge status: Home / Self Care | Payer: Self-pay | Source: Ambulatory Visit | Attending: Cardiology | Admitting: Cardiology

## 2023-04-03 ENCOUNTER — Ambulatory Visit
Admission: RE | Admit: 2023-04-03 | Discharge: 2023-04-03 | Disposition: A | Discharge status: Home / Self Care | Payer: BC Managed Care – PPO | Source: Ambulatory Visit | Attending: Internal Medicine | Admitting: Internal Medicine

## 2023-04-03 DIAGNOSIS — I2510 Atherosclerotic heart disease of native coronary artery without angina pectoris: Secondary | ICD-10-CM

## 2023-04-03 DIAGNOSIS — R079 Chest pain, unspecified: Secondary | ICD-10-CM | POA: Insufficient documentation

## 2023-04-03 MED ORDER — METOPROLOL TARTRATE 5 MG/5ML IV SOLN
5.0000 mg | Freq: Once | INTRAVENOUS | Status: AC
  Administered 2023-04-03: 5 mg via INTRAVENOUS
  Filled 2023-04-03: qty 5

## 2023-04-03 MED ORDER — IOHEXOL 350 MG/ML SOLN
80.0000 mL | Freq: Once | INTRAVENOUS | Status: AC | PRN
  Administered 2023-04-03: 80 mL via INTRAVENOUS

## 2023-04-03 MED ORDER — NITROGLYCERIN 0.4 MG SL SUBL
0.8000 mg | SUBLINGUAL_TABLET | Freq: Once | SUBLINGUAL | Status: AC
  Administered 2023-04-03: 0.8 mg via SUBLINGUAL
  Filled 2023-04-03: qty 25

## 2023-04-03 MED ORDER — METOPROLOL TARTRATE 5 MG/5ML IV SOLN
INTRAVENOUS | Status: AC
  Filled 2023-04-03: qty 5

## 2023-04-03 NOTE — Progress Notes (Signed)
Patient tolerated procedure well. Ambulate w/o difficulty. Denies any lightheadedness or being dizzy. Pt denies any pain at this time. Sitting in chair, pt is encouraged to drink additional water throughout the day and reason explained to patient. Patient verbalized understanding and all questions answered. ABC intact. No further needs at this time. Discharge from procedure area w/o issues.  

## 2023-06-01 ENCOUNTER — Encounter: Payer: Self-pay | Admitting: Emergency Medicine

## 2023-06-01 ENCOUNTER — Other Ambulatory Visit: Payer: Self-pay

## 2023-06-01 ENCOUNTER — Emergency Department
Admission: EM | Admit: 2023-06-01 | Discharge: 2023-06-01 | Disposition: A | Discharge status: Home / Self Care | Payer: BC Managed Care – PPO | Attending: Emergency Medicine | Admitting: Emergency Medicine

## 2023-06-01 DIAGNOSIS — E860 Dehydration: Secondary | ICD-10-CM | POA: Diagnosis not present

## 2023-06-01 DIAGNOSIS — R55 Syncope and collapse: Secondary | ICD-10-CM | POA: Diagnosis not present

## 2023-06-01 LAB — BASIC METABOLIC PANEL
Anion gap: 12 (ref 5–15)
BUN: 17 mg/dL (ref 8–23)
CO2: 23 mmol/L (ref 22–32)
Calcium: 9.2 mg/dL (ref 8.9–10.3)
Chloride: 100 mmol/L (ref 98–111)
Creatinine, Ser: 1.57 mg/dL — ABNORMAL HIGH (ref 0.61–1.24)
GFR, Estimated: 49 mL/min — ABNORMAL LOW (ref >60)
Glucose, Bld: 88 mg/dL (ref 70–99)
Potassium: 3.9 mmol/L (ref 3.5–5.1)
Sodium: 135 mmol/L (ref 135–145)

## 2023-06-01 LAB — URINALYSIS, ROUTINE W REFLEX MICROSCOPIC
Bilirubin Urine: NEGATIVE
Glucose, UA: NEGATIVE mg/dL
Hgb urine dipstick: NEGATIVE
Ketones, ur: 5 mg/dL — AB
Leukocytes,Ua: NEGATIVE
Nitrite: NEGATIVE
Protein, ur: 30 mg/dL — AB
Specific Gravity, Urine: 1.012 (ref 1.005–1.030)
pH: 5 (ref 5.0–8.0)

## 2023-06-01 LAB — CBC
HCT: 47 % (ref 39.0–52.0)
Hemoglobin: 15.6 g/dL (ref 13.0–17.0)
MCH: 30.8 pg (ref 26.0–34.0)
MCHC: 33.2 g/dL (ref 30.0–36.0)
MCV: 92.7 fL (ref 80.0–100.0)
Platelets: 278 10*3/uL (ref 150–400)
RBC: 5.07 MIL/uL (ref 4.22–5.81)
RDW: 12.3 % (ref 11.5–15.5)
WBC: 8.6 10*3/uL (ref 4.0–10.5)
nRBC: 0 % (ref 0.0–0.2)

## 2023-06-01 LAB — TROPONIN I (HIGH SENSITIVITY)
Troponin I (High Sensitivity): 20 ng/L — ABNORMAL HIGH (ref <18)
Troponin I (High Sensitivity): 29 ng/L — ABNORMAL HIGH (ref <18)

## 2023-06-01 LAB — CK: Total CK: 210 U/L (ref 49–397)

## 2023-06-01 MED ORDER — SODIUM CHLORIDE 0.9 % IV BOLUS
1000.0000 mL | Freq: Once | INTRAVENOUS | Status: AC
  Administered 2023-06-01: 1000 mL via INTRAVENOUS

## 2023-06-01 NOTE — ED Notes (Signed)
Dr. Vicente Males aware of troponin of 29.

## 2023-06-01 NOTE — ED Triage Notes (Signed)
[place] [facility] [place] 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 Pt states that he was pushing himself harder this morning because he was leading the class. Pt reports that his muscles felt over exerted and he felt dizzy prior to passing out. Pt reports that he was sitting when the episode occurred. Pt did not hit his head. Pt denies recent illness. Pt reports that he had not ate this morning, he did have 2 cups of coffee prior to exercising and water after his syncopal episode. Pt does take BP medication and took his regular dose last night. Pt is A & O x 4 and is in NAD.

## 2023-06-01 NOTE — ED Notes (Signed)
Family at bedside. 

## 2023-06-01 NOTE — ED Provider Notes (Signed)
[facility] Provider Note   Event Date/Time   First MD Initiated Contact with Patient 06/01/23 [phone]     (approximate) History  Loss of Consciousness  HPI Sean Bailey is a 67 y.o. male with a past medical history of ACS who presents complaining of a syncopal episode after an exercise class this morning.  Patient states that he was leaving the class and may have had more exertion than usual.  Patient states that after class and ended he was standing next to a wall in the room when other patrons noticed it was difficult to get his attention.  Patient states that he does not remember exactly what happened and denies any preceding symptoms of palpitation, chest pain, shortness of breath, or diaphoresis.  Patient states that he "came back to" when he was being carried to the ground and bystanders stated that he had "gone limp" for approximately 30 seconds.  Patient came back to baseline within 30 seconds after being helped to the ground.  Patient denies any head injury.  Patient denies any blood thinner use. ROS: Patient currently denies any vision changes, tinnitus, difficulty speaking, facial droop, sore throat, chest pain, shortness of breath, abdominal pain, nausea/vomiting/diarrhea, dysuria, or weakness/numbness/paresthesias in any extremity   Physical Exam  Triage Vital Signs: ED Triage Vitals  Encounter Vitals Group     BP 06/01/23 0844 (!) 92/55     Systolic BP Percentile --      Diastolic BP Percentile --      Pulse Rate 06/01/23 0844 (!) 102     Resp 06/01/23 0844 16     Temp 06/01/23 0844 97.9 F (36.6 C)     Temp Source 06/01/23 0844 Oral     SpO2 06/01/23 0844 98 %     Weight 06/01/23 0847 196 lb (88.9 kg)     Height 06/01/23 0847 5\' 7"  (1.702 m)     Head Circumference --      Peak Flow --      Pain Score 06/01/23 0844 0     Pain Loc --      Pain Education --      Exclude from Growth Chart --    Most recent vital signs: Vitals:   06/01/23  1300 06/01/23 1330  BP: (!) 148/77 (!) 140/69  Pulse: 91   Resp: (!) 21 16  Temp:    SpO2: 95%    General: Awake, oriented x4. CV:  Good peripheral perfusion.  Resp:  Normal effort.  Abd:  No distention.  Other:  Elderly overweight Caucasian male resting comfortably in no acute distress ED Results / Procedures / Treatments  Labs (all labs ordered are listed, but only abnormal results are displayed) Labs Reviewed  BASIC METABOLIC PANEL - Abnormal; Notable for the following components:      Result Value   Creatinine, Ser 1.57 (*)    GFR, Estimated 49 (*)    All other components within normal limits  URINALYSIS, ROUTINE W REFLEX MICROSCOPIC - Abnormal; Notable for the following components:   Color, Urine YELLOW (*)    APPearance HAZY (*)    Ketones, ur 5 (*)    Protein, ur 30 (*)    Bacteria, UA RARE (*)    All other components within normal limits  TROPONIN I (HIGH SENSITIVITY) - Abnormal; Notable for the following components:   Troponin I (High Sensitivity) 20 (*)    All other components within normal limits  TROPONIN I (HIGH SENSITIVITY) -  Abnormal; Notable for the following components:   Troponin I (High Sensitivity) 29 (*)    All other components within normal limits  CBC  CK   EKG ED ECG REPORT I, Merwyn Katos, the attending physician, personally viewed and interpreted this ECG. Date: 06/01/2023 EKG Time: 0849 Rate: 102 Rhythm: Tachycardic sinus rhythm QRS Axis: normal Intervals: Right bundle branch block ST/T Wave abnormalities: normal Narrative Interpretation: Tachycardic sinus rhythm with right bundle branch block.  No evidence of acute ischemia PROCEDURES: Critical Care performed: No .1-3 Lead EKG Interpretation  Performed by: Merwyn Katos, MD Authorized by: Merwyn Katos, MD     Interpretation: normal     ECG rate:  71   ECG rate assessment: normal     Rhythm: sinus rhythm     Ectopy: none     Conduction: normal    MEDICATIONS ORDERED IN  ED: Medications  sodium chloride 0.9 % bolus 1,000 mL (0 mLs Intravenous Stopped 06/01/23 1227)   IMPRESSION / MDM / ASSESSMENT AND PLAN / ED COURSE  I reviewed the triage vital signs and the nursing notes.                             The patient is on the cardiac monitor to evaluate for evidence of arrhythmia and/or significant heart rate changes. Patient's presentation is most consistent with acute presentation with potential threat to life or bodily function. Patient presents with complaints of syncope/presyncope ED Workup:  CBC, BMP, Troponin, BNP, ECG, CXR Differential diagnosis includes HF, ICH, seizure, stroke, HOCM, ACS, aortic dissection, malignant arrhythmia, or GI bleed. Findings: No evidence of acute laboratory abnormalities.  Troponin negative 20-->29.  No significant change on repeat troponin and patient denying any chest pain EKG: No e/o STEMI. No evidence of Brugada's sign, delta wave, epsilon wave, significantly prolonged QTc, or malignant arrhythmia.  Disposition: Discharge. Patient is at baseline at this time. Return precautions expressed and understood in person. Advised follow up with primary care provider or clinic physician in next 24 hours.   FINAL CLINICAL IMPRESSION(S) / ED DIAGNOSES   Final diagnoses:  Syncope and collapse  Dehydration   Rx / DC Orders   ED Discharge Orders     None      Note:  This document was prepared using Dragon voice recognition software and may include unintentional dictation errors.   Merwyn Katos, MD 06/02/23 1146

## 2023-06-01 NOTE — ED Notes (Signed)
First Nurse Note: [place] [facility] [place] for [place]. Pt was exercising when the episode occurred. EMS reports pts initial BP was 98/56. Pt was given 500 mL bolus of NS. Pts BP improved to 112/80.  HR: 105-110 SpO2- 98% CBG- 169  Pt has 18 G IV in his left forearm
